# Patient Record
Sex: Male | Born: 1945
Health system: Southern US, Community
[De-identification: ages and names within clinical notes are randomized; demographics above are authoritative.]

## PROBLEM LIST (undated history)

## (undated) DIAGNOSIS — M1710 Unilateral primary osteoarthritis, unspecified knee: Secondary | ICD-10-CM

## (undated) DIAGNOSIS — E785 Hyperlipidemia, unspecified: Secondary | ICD-10-CM

## (undated) DIAGNOSIS — M179 Osteoarthritis of knee, unspecified: Secondary | ICD-10-CM

## (undated) DIAGNOSIS — I509 Heart failure, unspecified: Secondary | ICD-10-CM

## (undated) DIAGNOSIS — I10 Essential (primary) hypertension: Secondary | ICD-10-CM

## (undated) HISTORY — DX: Unilateral primary osteoarthritis, unspecified knee: M17.10

## (undated) HISTORY — DX: Heart failure, unspecified: I50.9

## (undated) HISTORY — PX: EYE SURGERY: SHX253

## (undated) HISTORY — DX: Hyperlipidemia, unspecified: E78.5

## (undated) HISTORY — DX: Osteoarthritis of knee, unspecified: M17.9

---

## 2015-03-13 DIAGNOSIS — Z23 Encounter for immunization: Secondary | ICD-10-CM | POA: Diagnosis not present

## 2015-10-26 ENCOUNTER — Emergency Department (HOSPITAL_COMMUNITY)
Admission: EM | Admit: 2015-10-26 | Discharge: 2015-10-26 | Disposition: A | Discharge status: Home / Self Care | Payer: Medicare Other | Attending: Emergency Medicine | Admitting: Emergency Medicine

## 2015-10-26 ENCOUNTER — Emergency Department (HOSPITAL_COMMUNITY): Payer: Medicare Other

## 2015-10-26 DIAGNOSIS — Y939 Activity, unspecified: Secondary | ICD-10-CM | POA: Insufficient documentation

## 2015-10-26 DIAGNOSIS — S0001XA Abrasion of scalp, initial encounter: Secondary | ICD-10-CM | POA: Insufficient documentation

## 2015-10-26 DIAGNOSIS — T148 Other injury of unspecified body region: Secondary | ICD-10-CM | POA: Diagnosis not present

## 2015-10-26 DIAGNOSIS — S299XXA Unspecified injury of thorax, initial encounter: Secondary | ICD-10-CM | POA: Diagnosis not present

## 2015-10-26 DIAGNOSIS — S0990XA Unspecified injury of head, initial encounter: Secondary | ICD-10-CM | POA: Diagnosis present

## 2015-10-26 DIAGNOSIS — Y92410 Unspecified street and highway as the place of occurrence of the external cause: Secondary | ICD-10-CM | POA: Diagnosis not present

## 2015-10-26 DIAGNOSIS — Y999 Unspecified external cause status: Secondary | ICD-10-CM | POA: Insufficient documentation

## 2015-10-26 DIAGNOSIS — M542 Cervicalgia: Secondary | ICD-10-CM

## 2015-10-26 DIAGNOSIS — Z23 Encounter for immunization: Secondary | ICD-10-CM | POA: Diagnosis not present

## 2015-10-26 DIAGNOSIS — S199XXA Unspecified injury of neck, initial encounter: Secondary | ICD-10-CM | POA: Diagnosis not present

## 2015-10-26 LAB — CBC WITH DIFFERENTIAL/PLATELET
Basophils Absolute: 0 10*3/uL (ref 0.0–0.1)
Basophils Relative: 0 %
Eosinophils Absolute: 0.1 10*3/uL (ref 0.0–0.7)
Eosinophils Relative: 1 %
HCT: 40.8 % (ref 39.0–52.0)
Hemoglobin: 13.2 g/dL (ref 13.0–17.0)
Lymphocytes Relative: 19 %
Lymphs Abs: 2 10*3/uL (ref 0.7–4.0)
MCH: 29.3 pg (ref 26.0–34.0)
MCHC: 32.4 g/dL (ref 30.0–36.0)
MCV: 90.7 fL (ref 78.0–100.0)
Monocytes Absolute: 0.6 10*3/uL (ref 0.1–1.0)
Monocytes Relative: 6 %
Neutro Abs: 7.6 10*3/uL (ref 1.7–7.7)
Neutrophils Relative %: 74 %
Platelets: 250 10*3/uL (ref 150–400)
RBC: 4.5 MIL/uL (ref 4.22–5.81)
RDW: 14.2 % (ref 11.5–15.5)
WBC: 10.3 10*3/uL (ref 4.0–10.5)

## 2015-10-26 LAB — LIPASE, BLOOD: Lipase: 28 U/L (ref 11–51)

## 2015-10-26 LAB — COMPREHENSIVE METABOLIC PANEL WITH GFR
ALT: 17 U/L (ref 17–63)
AST: 22 U/L (ref 15–41)
Albumin: 4.1 g/dL (ref 3.5–5.0)
Alkaline Phosphatase: 53 U/L (ref 38–126)
Anion gap: 8 (ref 5–15)
BUN: 16 mg/dL (ref 6–20)
CO2: 23 mmol/L (ref 22–32)
Calcium: 9.3 mg/dL (ref 8.9–10.3)
Chloride: 105 mmol/L (ref 101–111)
Creatinine, Ser: 1.33 mg/dL — ABNORMAL HIGH (ref 0.61–1.24)
GFR calc Af Amer: >60 mL/min
GFR calc non Af Amer: 54 mL/min — ABNORMAL LOW
Glucose, Bld: 112 mg/dL — ABNORMAL HIGH (ref 65–99)
Potassium: 3.2 mmol/L — ABNORMAL LOW (ref 3.5–5.1)
Sodium: 136 mmol/L (ref 135–145)
Total Bilirubin: 0.5 mg/dL (ref 0.3–1.2)
Total Protein: 7 g/dL (ref 6.5–8.1)

## 2015-10-26 MED ORDER — TETANUS-DIPHTHERIA TOXOIDS TD 5-2 LFU IM INJ
0.5000 mL | INJECTION | Freq: Once | INTRAMUSCULAR | Status: DC
  Filled 2015-10-26: qty 0.5

## 2015-10-26 MED ORDER — MELOXICAM 15 MG PO TABS: 15.0000 mg | ORAL_TABLET | Freq: Every day | ORAL | 0 refills | Status: DC | PRN

## 2015-10-26 MED ORDER — LIDOCAINE-EPINEPHRINE (PF) 2 %-1:200000 IJ SOLN: 20.0000 mL | Freq: Once | INTRAMUSCULAR | Status: DC

## 2015-10-26 MED ORDER — OXYCODONE-ACETAMINOPHEN 5-325 MG PO TABS
1.0000 | ORAL_TABLET | Freq: Once | ORAL | Status: AC
  Administered 2015-10-26: 1 via ORAL
  Filled 2015-10-26: qty 1

## 2015-10-26 MED ORDER — IBUPROFEN 400 MG PO TABS
600.0000 mg | ORAL_TABLET | Freq: Once | ORAL | Status: AC
  Administered 2015-10-26: 600 mg via ORAL
  Filled 2015-10-26: qty 1

## 2015-10-26 MED ORDER — TETANUS-DIPHTH-ACELL PERTUSSIS 5-2.5-18.5 LF-MCG/0.5 IM SUSP
0.5000 mL | Freq: Once | INTRAMUSCULAR | Status: AC
  Administered 2015-10-26: 0.5 mL via INTRAMUSCULAR
  Filled 2015-10-26: qty 0.5
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
## 2015-10-26 NOTE — ED Notes (Signed)
Pt refusing to wear a gown, he did allow this RN to assess his back and abdomen. Pt has an old scar to the middle of his back, no other abnormalities noted.
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
## 2015-12-27 DIAGNOSIS — Z23 Encounter for immunization: Secondary | ICD-10-CM | POA: Diagnosis not present

## 2016-01-30 ENCOUNTER — Encounter (HOSPITAL_BASED_OUTPATIENT_CLINIC_OR_DEPARTMENT_OTHER): Payer: Self-pay

## 2016-01-30 ENCOUNTER — Emergency Department (HOSPITAL_BASED_OUTPATIENT_CLINIC_OR_DEPARTMENT_OTHER): Payer: Medicare Other

## 2016-01-30 ENCOUNTER — Inpatient Hospital Stay (HOSPITAL_BASED_OUTPATIENT_CLINIC_OR_DEPARTMENT_OTHER)
Admission: EM | Admit: 2016-01-30 | Discharge: 2016-02-01 | DRG: 292 | Disposition: A | Discharge status: Home / Self Care | Payer: Medicare Other | Attending: Internal Medicine | Admitting: Internal Medicine

## 2016-01-30 DIAGNOSIS — F1010 Alcohol abuse, uncomplicated: Secondary | ICD-10-CM | POA: Diagnosis present

## 2016-01-30 DIAGNOSIS — I509 Heart failure, unspecified: Secondary | ICD-10-CM | POA: Diagnosis not present

## 2016-01-30 DIAGNOSIS — I447 Left bundle-branch block, unspecified: Secondary | ICD-10-CM | POA: Diagnosis not present

## 2016-01-30 DIAGNOSIS — I10 Essential (primary) hypertension: Secondary | ICD-10-CM | POA: Diagnosis not present

## 2016-01-30 DIAGNOSIS — R0602 Shortness of breath: Secondary | ICD-10-CM

## 2016-01-30 DIAGNOSIS — Z87891 Personal history of nicotine dependence: Secondary | ICD-10-CM | POA: Diagnosis not present

## 2016-01-30 DIAGNOSIS — I119 Hypertensive heart disease without heart failure: Secondary | ICD-10-CM | POA: Diagnosis present

## 2016-01-30 DIAGNOSIS — I426 Alcoholic cardiomyopathy: Secondary | ICD-10-CM | POA: Diagnosis present

## 2016-01-30 DIAGNOSIS — I472 Ventricular tachycardia: Secondary | ICD-10-CM | POA: Diagnosis not present

## 2016-01-30 DIAGNOSIS — I110 Hypertensive heart disease with heart failure: Principal | ICD-10-CM | POA: Diagnosis present

## 2016-01-30 DIAGNOSIS — R918 Other nonspecific abnormal finding of lung field: Secondary | ICD-10-CM | POA: Diagnosis present

## 2016-01-30 DIAGNOSIS — I5021 Acute systolic (congestive) heart failure: Secondary | ICD-10-CM | POA: Diagnosis present

## 2016-01-30 DIAGNOSIS — I5041 Acute combined systolic (congestive) and diastolic (congestive) heart failure: Secondary | ICD-10-CM

## 2016-01-30 DIAGNOSIS — E785 Hyperlipidemia, unspecified: Secondary | ICD-10-CM | POA: Diagnosis present

## 2016-01-30 DIAGNOSIS — I4729 Other ventricular tachycardia: Secondary | ICD-10-CM

## 2016-01-30 HISTORY — DX: Essential (primary) hypertension: I10

## 2016-01-30 LAB — CBC
HEMATOCRIT: 38.1 % — AB (ref 39.0–52.0)
HEMOGLOBIN: 12.4 g/dL — AB (ref 13.0–17.0)
MCH: 29.4 pg (ref 26.0–34.0)
MCHC: 32.5 g/dL (ref 30.0–36.0)
MCV: 90.3 fL (ref 78.0–100.0)
Platelets: 287 10*3/uL (ref 150–400)
RBC: 4.22 MIL/uL (ref 4.22–5.81)
RDW: 14.1 % (ref 11.5–15.5)
WBC: 8.1 10*3/uL (ref 4.0–10.5)

## 2016-01-30 LAB — BASIC METABOLIC PANEL
ANION GAP: 9 (ref 5–15)
BUN: 12 mg/dL (ref 6–20)
CALCIUM: 9.3 mg/dL (ref 8.9–10.3)
CO2: 24 mmol/L (ref 22–32)
Chloride: 104 mmol/L (ref 101–111)
Creatinine, Ser: 0.96 mg/dL (ref 0.61–1.24)
GLUCOSE: 97 mg/dL (ref 65–99)
POTASSIUM: 3.7 mmol/L (ref 3.5–5.1)
Sodium: 137 mmol/L (ref 135–145)

## 2016-01-30 LAB — TROPONIN I
TROPONIN I: 0.04 ng/mL — AB (ref <0.03)
TROPONIN I: 0.04 ng/mL — AB (ref <0.03)

## 2016-01-30 LAB — BRAIN NATRIURETIC PEPTIDE: B NATRIURETIC PEPTIDE 5: 918.4 pg/mL — AB (ref 0.0–100.0)

## 2016-01-30 IMAGING — DX DG CHEST 2V
2 series · 2 of 2 positions shown · non-contrast
Comparison: [DATE]

CLINICAL DATA: Short of breath

EXAM:
CHEST  2 VIEW

[chest pa]
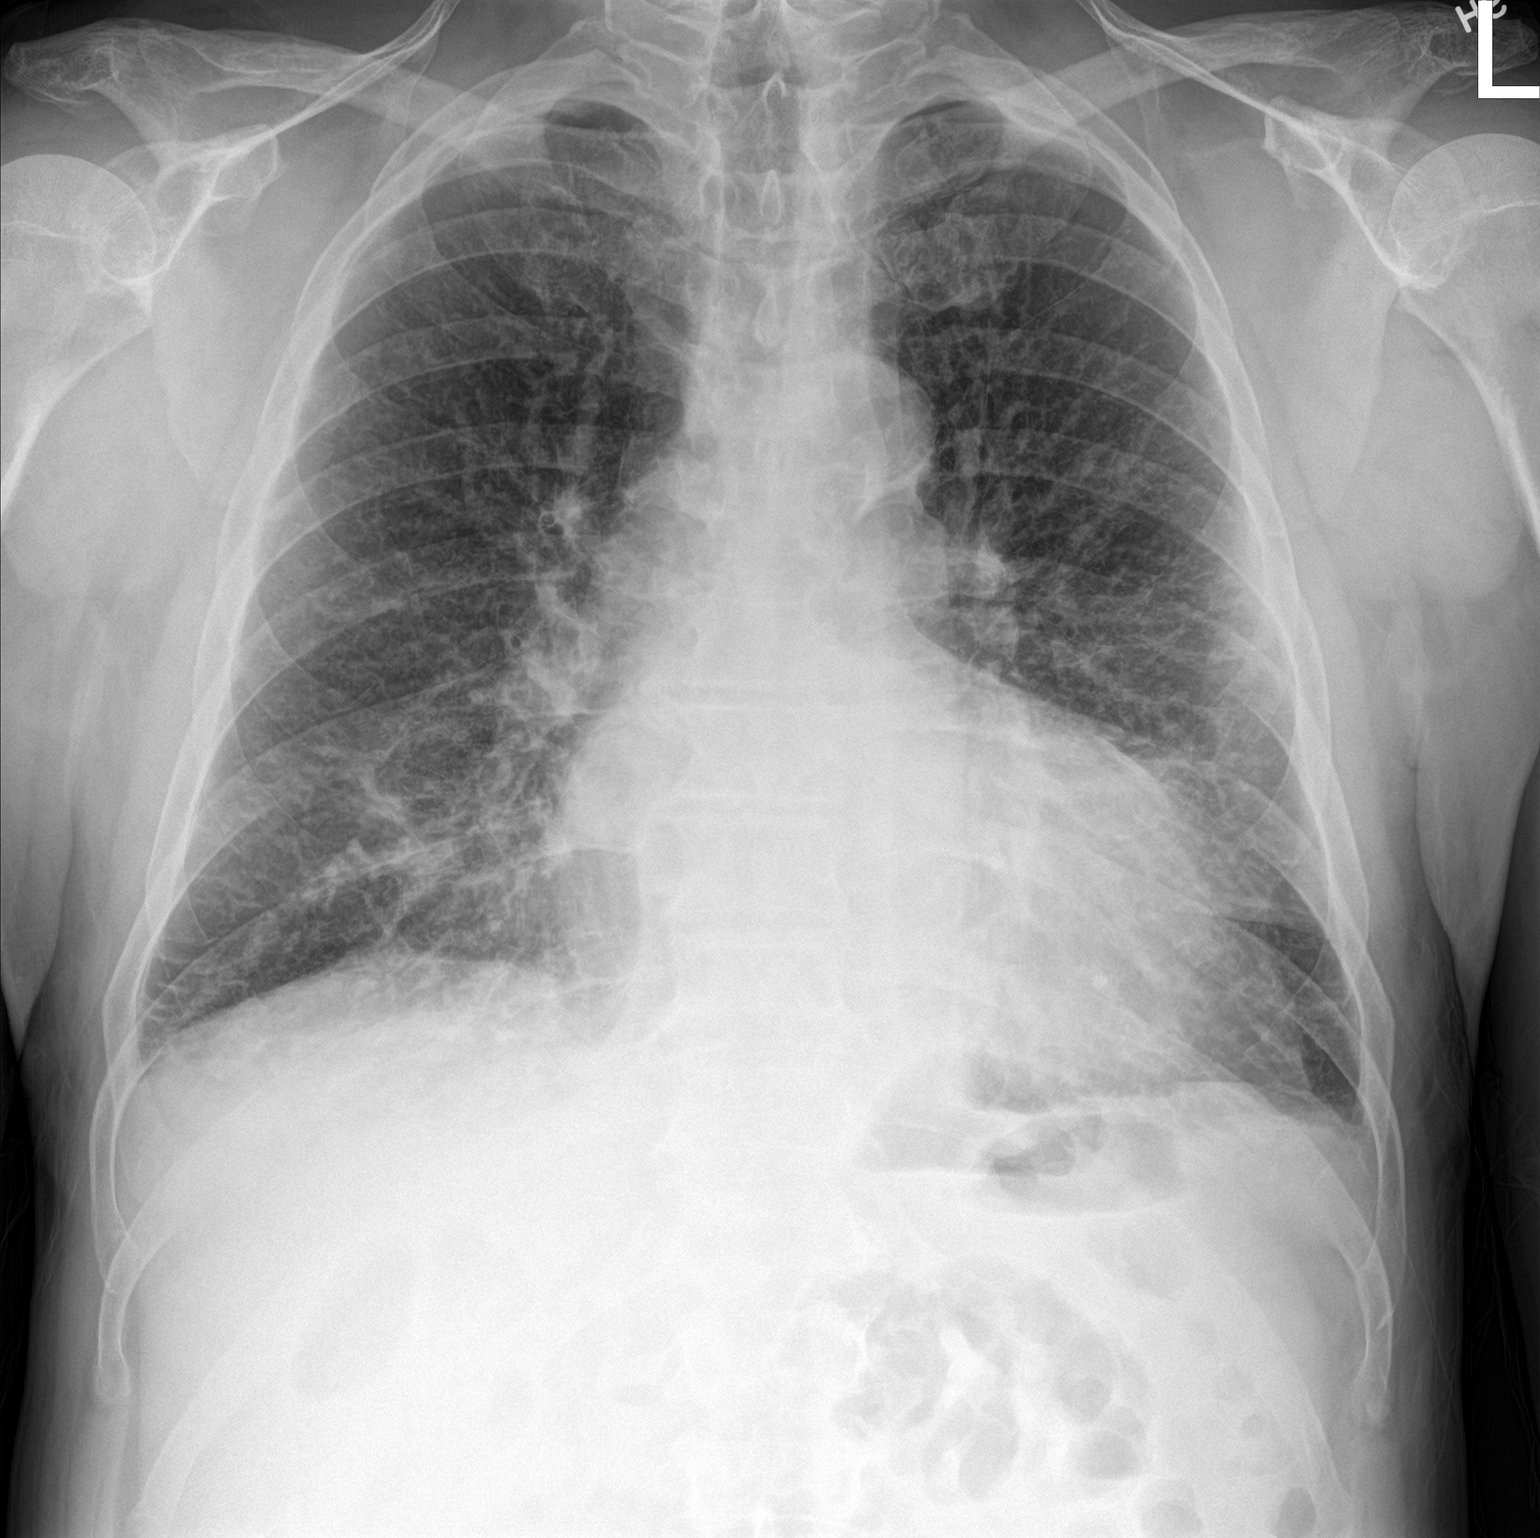

[chest lat]
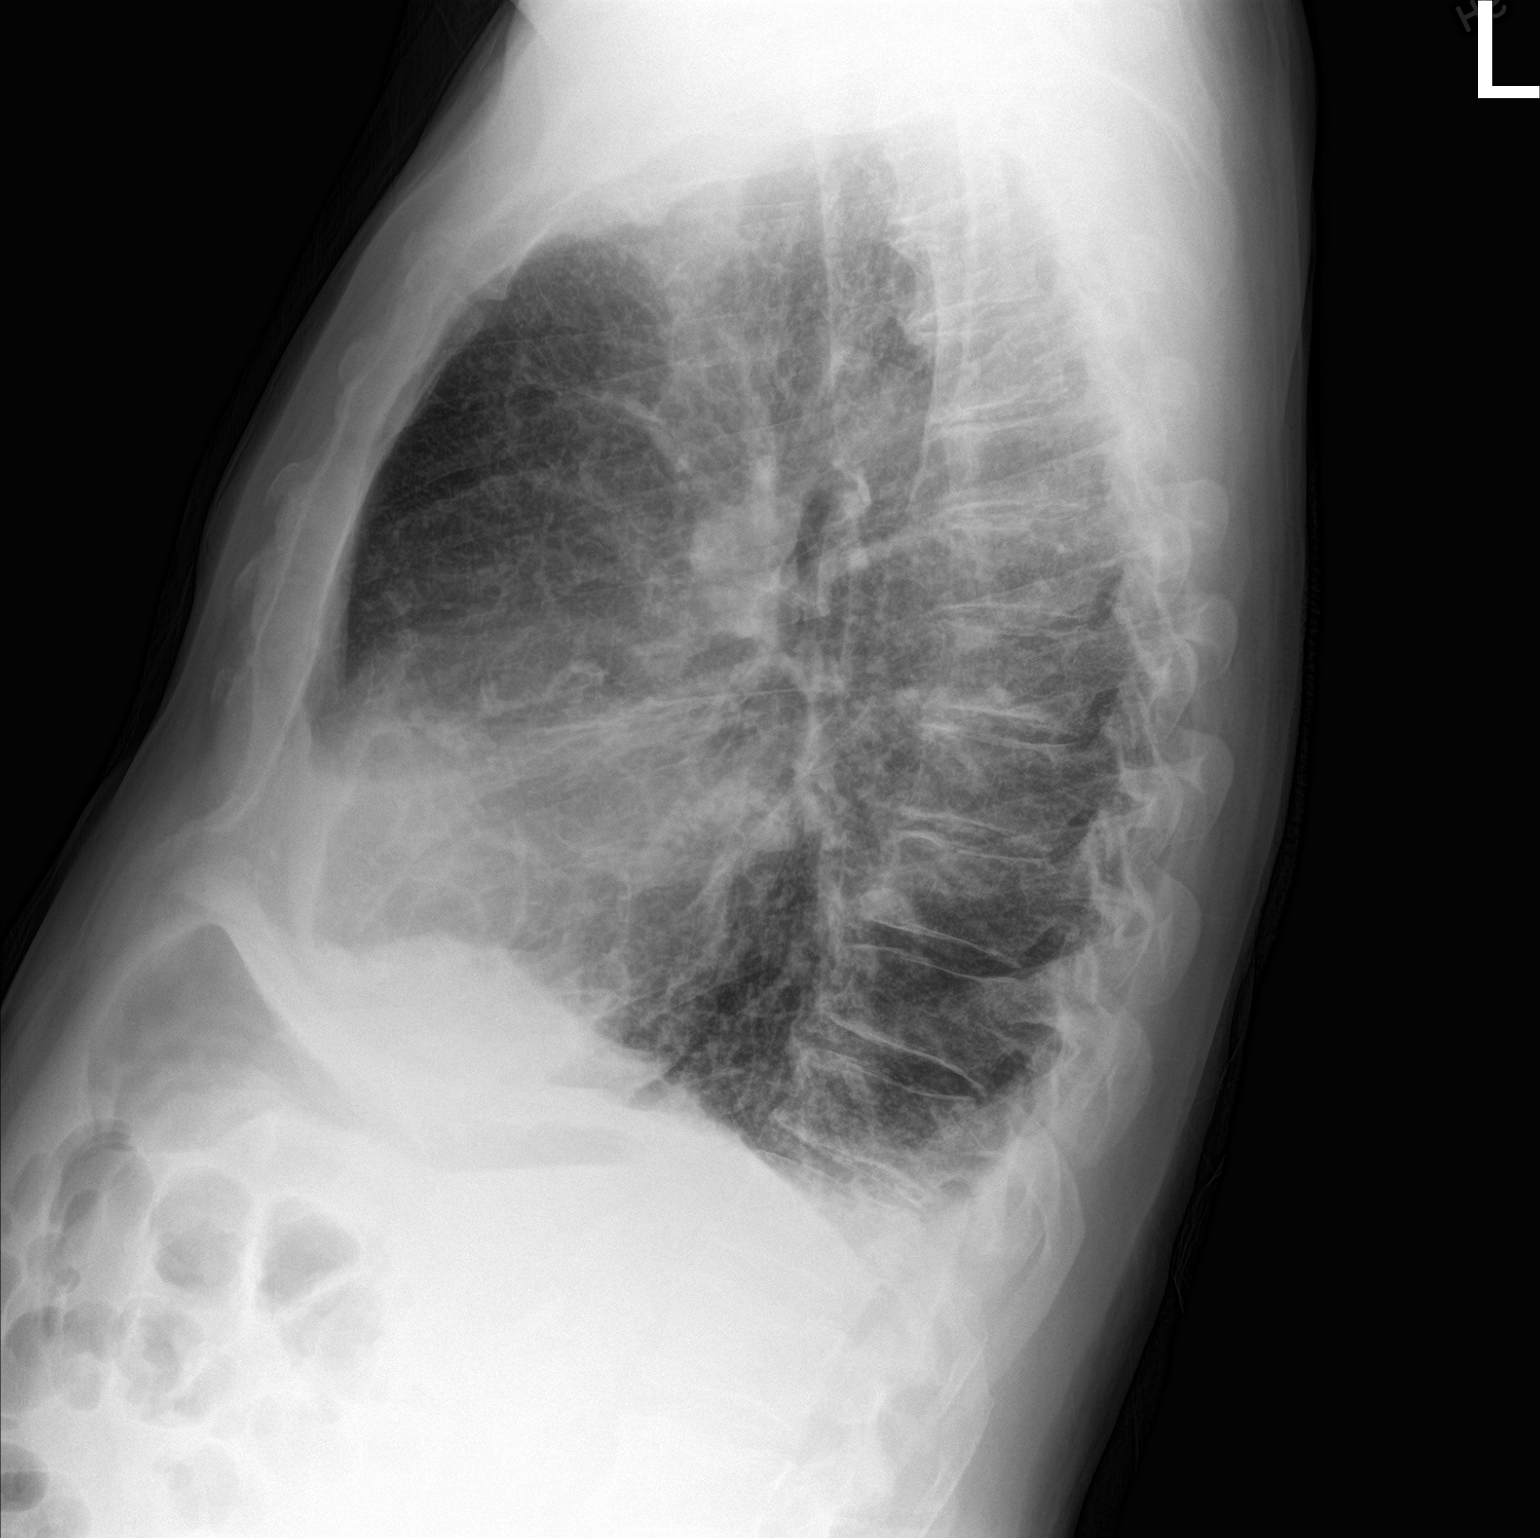

[2 of 2 positions shown; findings below may reference images not displayed]

FINDINGS: Cardiac enlargement.

Increased interstitial markings in the bases show mild progression
from the prior study. Small bilateral pleural effusions. Mild right
lower lobe airspace disease has progressed.
IMPRESSION: Interstitial lung markings have progressed in the interval with
small bilateral effusions. This may reflect mild fluid overload.

## 2016-01-30 MED ORDER — FUROSEMIDE 10 MG/ML IJ SOLN
20.0000 mg | Freq: Every day | INTRAMUSCULAR | Status: DC
  Administered 2016-01-31: 20 mg via INTRAVENOUS
  Filled 2016-01-30: qty 2

## 2016-01-30 MED ORDER — SODIUM CHLORIDE 0.9 % IV SOLN: 250.0000 mL | INTRAVENOUS | Status: DC | PRN

## 2016-01-30 MED ORDER — IOPAMIDOL (ISOVUE-370) INJECTION 76%
100.0000 mL | Freq: Once | INTRAVENOUS | Status: AC | PRN
  Administered 2016-01-30: 100 mL via INTRAVENOUS

## 2016-01-30 MED ORDER — FUROSEMIDE 10 MG/ML IJ SOLN: 40.0000 mg | Freq: Every day | INTRAMUSCULAR | Status: DC

## 2016-01-30 MED ORDER — ENOXAPARIN SODIUM 40 MG/0.4ML ~~LOC~~ SOLN
40.0000 mg | SUBCUTANEOUS | Status: DC
  Administered 2016-01-30 – 2016-01-31 (×2): 40 mg via SUBCUTANEOUS
  Filled 2016-01-30 (×2): qty 0.4

## 2016-01-30 MED ORDER — LISINOPRIL 5 MG PO TABS
5.0000 mg | ORAL_TABLET | Freq: Every day | ORAL | Status: DC
  Administered 2016-01-31 – 2016-02-01 (×2): 5 mg via ORAL
  Filled 2016-01-30 (×2): qty 1

## 2016-01-30 MED ORDER — ONDANSETRON HCL 4 MG/2ML IJ SOLN: 4.0000 mg | Freq: Four times a day (QID) | INTRAMUSCULAR | Status: DC | PRN

## 2016-01-30 MED ORDER — SODIUM CHLORIDE 0.9% FLUSH
3.0000 mL | Freq: Two times a day (BID) | INTRAVENOUS | Status: DC
  Administered 2016-01-30 – 2016-02-01 (×4): 3 mL via INTRAVENOUS

## 2016-01-30 MED ORDER — ACETAMINOPHEN 325 MG PO TABS: 650.0000 mg | ORAL_TABLET | ORAL | Status: DC | PRN

## 2016-01-30 MED ORDER — SODIUM CHLORIDE 0.9% FLUSH: 3.0000 mL | INTRAVENOUS | Status: DC | PRN

## 2016-01-30 MED ORDER — FUROSEMIDE 10 MG/ML IJ SOLN
20.0000 mg | Freq: Once | INTRAMUSCULAR | Status: AC
  Administered 2016-01-30: 20 mg via INTRAVENOUS
  Filled 2016-01-30: qty 2

## 2016-01-30 NOTE — ED Provider Notes (Signed)
  Physical Exam  BP (!) 165/120   Pulse 98   Temp 97.6 F (36.4 C) (Oral)   Resp 18   Ht 5\' 9"  (1.753 m)   Wt 86.2 kg   SpO2 97%   BMI 28.06 kg/m   Physical Exam  ED Course  Procedures  MDM 4:34 PM- Sign out from Previous Provider Domenic Moras, PA-C  Per Previous MDM- Patient here with exertional shortness of breath, gradual in onset. He does not follow with primary care doctor and does not have any significant past medical history aside from hypertension not currently being treated. Given his age and his complaint, cardiac workup performed. A d-dimer ordered to help rule out PE. I will also check a BNP, troponin, EKG and chest x-ray. Patient is not actively wheezing and is not hypoxic on exam.   EKG with LBBB, no old EKG for comparison.  Pt denies active CP. Initial chest x-ray shows mild CHF.  A chest CT angiogram was performed demonstrating small bilateral pleural effusions without evidence of PE.  Mildly elevated troponin of 0.04. His BNP is elevated at 918.  Lasix 20mg  IV given.  Admit for new onset heart failure, new left bundle branch block. Patient was understanding and agrees to plan.    4:43 PM- Spoke with Dr. Marily Memos. Will Admit to [place], PA-C 01/30/16 1643    Charlesetta Shanks, MD 02/03/16 [phone]

## 2016-01-30 NOTE — ED Provider Notes (Signed)
[facility] DEPT [facility] Provider Note   CSN: [id number] Arrival date & time: 01/30/16  1306     History   Chief Complaint Chief Complaint  Patient presents with  . Shortness of Breath    HPI Shane Gomez is a 70 y.o. male.  HPI   70 year old male who is a former smoker presenting with complaints of shortness of breath. Patient reports gradual onset of shortness of breath ongoing for the past week. Soreness of breath is worsening with exertion such as running or walking up and down the ladder. He reported occasional episodes of shortness of breath with diaphoresis and lightheadedness. Last episode was earlier this morning when he walked down ladder. He denies any associated fever, productive cough, headache, chest pain, nausea, vomiting, dizziness, productive cough, hemoptysis. No prior history of PE or DVT, no recent surgery, prolonged bed rest, UA Rx swelling or calf pain, active cancer.  Does have a history of hypertension but currently not taking any medication and does not have a primary care provider. Denies any history of cardiac disease and no recent stress test. Former smoker last use was 30 years ago. Patient has not notice any increased fluid gain. He sleeps with 2 pillows  Past Medical History:  Diagnosis Date  . Hypertension     There are no active problems to display for this patient.   Past Surgical History:  Procedure Laterality Date  . EYE SURGERY         Home Medications    Prior to Admission medications   Not on File    Family History No family history on file.  Social History Social History  Substance Use Topics  . Smoking status: Former Research scientist (life sciences)  . Smokeless tobacco: Never Used  . Alcohol use Yes     Comment: occ     Allergies   Review of patient's allergies indicates no known allergies.   Review of Systems Review of Systems  All other systems reviewed and are negative.    Physical Exam Updated Vital Signs BP (!) 165/113 (BP  Location: Left Arm)   Pulse 104   Temp 97.6 F (36.4 C) (Oral)   Resp 18   Ht 5\' 9"  (1.753 m)   Wt 86.2 kg   SpO2 98%   BMI 28.06 kg/m   Physical Exam  Constitutional: He appears well-developed and well-nourished. No distress.  HENT:  Head: Atraumatic.  Eyes: Conjunctivae are normal.  Neck: Neck supple. No JVD present.  Cardiovascular: Regular rhythm and intact distal pulses.   Mild tachycardia without murmurs or gallops  Pulmonary/Chest: Effort normal and breath sounds normal. He has no wheezes. He has no rales.  Abdominal: Soft. There is no tenderness.  Musculoskeletal: He exhibits no edema.  Neurological: He is alert.  Skin: No rash noted.  Psychiatric: He has a normal mood and affect.  Nursing note and vitals reviewed.    ED Treatments / Results  Labs (all labs ordered are listed, but only abnormal results are displayed) Labs Reviewed  CBC - Abnormal; Notable for the following:       Result Value   Hemoglobin 12.4 (*)    HCT 38.1 (*)    All other components within normal limits  TROPONIN I - Abnormal; Notable for the following:    Troponin I 0.04 (*)    All other components within normal limits  BRAIN NATRIURETIC PEPTIDE - Abnormal; Notable for the following:    B Natriuretic Peptide 918.4 (*)    All other components  within normal limits  BASIC METABOLIC PANEL  TROPONIN I    EKG  EKG Interpretation  Date/Time:  Tuesday January 30 2016 14:12:17 EDT Ventricular Rate:  94 PR Interval:    QRS Duration: 121 QT Interval:  397 QTC Calculation: 497 R Axis:   78 Text Interpretation:  Sinus rhythm Probable left atrial enlargement Left bundle branch block agree. no old comparison Confirmed by Johnney Killian, MD, Jeannie Done [id number]) on 01/30/2016 2:45:14 PM       Radiology Dg Chest 2 View  Result Date: 01/30/2016 CLINICAL DATA:  Short of breath EXAM: CHEST  2 VIEW COMPARISON:  10/26/2015 FINDINGS: Cardiac enlargement. Increased interstitial markings in the bases show  mild progression from the prior study. Small bilateral pleural effusions. Mild right lower lobe airspace disease has progressed. IMPRESSION: Interstitial lung markings have progressed in the interval with small bilateral effusions. This may reflect mild fluid overload. Electronically Signed   By: Franchot Gallo M.D.   On: 01/30/2016 13:47   Ct Angio Chest Pe W Or Wo Contrast  Result Date: 01/30/2016 CLINICAL DATA:  Shortness of breath with exertion for 1 week EXAM: CT ANGIOGRAPHY CHEST WITH CONTRAST TECHNIQUE: Multidetector CT imaging of the chest was performed using the standard protocol during bolus administration of intravenous contrast. Multiplanar CT image reconstructions and MIPs were obtained to evaluate the vascular anatomy. CONTRAST:  100 mL Isovue 370. COMPARISON:  Plain film from earlier in the same day FINDINGS: Cardiovascular: Thoracic aorta shows atherosclerotic calcifications without aneurysmal dilatation. The degree of opacification is minimal precluding evaluation for possible dissection. Heavy coronary calcifications are seen. The pulmonary artery is well visualized and demonstrates a normal branching pattern. No filling defects to suggest pulmonary emboli are identified. Mediastinum/Nodes: Small right peritracheal node is noted measuring 10 mm in short axis. No other significant hilar or mediastinal adenopathy is seen. Lungs/Pleura: Small bilateral pleural effusions are noted right slightly greater than left. Some patchy areas of subpleural infiltrate are noted bilaterally. A small 6 mm nodule is noted in the right upper lobe medially best seen on image number 23 of series 5. A 12 mm sub solid density is noted in the left upper lobe best seen on image number 48 of series 5. Mild scarring is noted in the right middle lobe. Mild interstitial thickening is noted throughout both lungs which may represent a a mild degree of interstitial edema. Upper Abdomen: No acute abnormality. Musculoskeletal:  Degenerative changes of thoracic spine are noted. Review of the MIP images confirms the above findings. IMPRESSION: Small bilateral pleural effusions. No evidence of pulmonary emboli. Subpleural infiltrative changes seen bilaterally but particularly on the left. 6 mm nodule in the right upper lobe medially and sub solid left upper lobe nodule measuring 12 mm. Follow-up non-contrast CT recommended at 3-6 months to confirm persistence. If unchanged, and solid component remains <6 mm, annual CT is recommended until 5 years of stability has been established. If persistent these nodules should be considered highly suspicious if the solid component of the nodule is 6 mm or greater in size and enlarging. This recommendation follows the consensus statement: Guidelines for Management of Incidental Pulmonary Nodules Detected on CT Images: From the Fleischner Society 2017; Radiology 2017; 284:228-243. Mild interstitial edema. Electronically Signed   By: Inez Catalina M.D.   On: 01/30/2016 15:24    Procedures Procedures (including critical care time)  Medications Ordered in ED Medications  furosemide (LASIX) injection 20 mg (not administered)  iopamidol (ISOVUE-370) 76 % injection 100 mL (100 mLs  Intravenous Contrast Given 01/30/16 1459)     Initial Impression / Assessment and Plan / ED Course  I have reviewed the triage vital signs and the nursing notes.  Pertinent labs & imaging results that were available during my care of the patient were reviewed by me and considered in my medical decision making (see chart for details).  Clinical Course    BP (!) 165/120   Pulse 98   Temp 97.6 F (36.4 C) (Oral)   Resp 18   Ht 5\' 9"  (1.753 m)   Wt 86.2 kg   SpO2 97%   BMI 28.06 kg/m    Final Clinical Impressions(s) / ED Diagnoses   Final diagnoses:  Acute congestive heart failure, unspecified congestive heart failure type (HCC)  Left bundle branch block  Shortness of breath    New Prescriptions New  Prescriptions   No medications on file   2:02 PM Patient here with exertional shortness of breath, gradual in onset. He does not follow with primary care doctor and does not have any significant past medical history aside from hypertension not currently being treated. Given his age and his complaint, cardiac workup performed. A d-dimer ordered to help rule out PE. I will also check a BNP, troponin, EKG and chest x-ray. Patient is not actively wheezing and is not hypoxic on exam.   2:45 PM EKG with LBBB, no old EKG for comparison.  Pt denies active CP.  If PE study unremarkable, will consider obs admission for CP r/o.  3:52 PM Initial chest x-ray shows mild CHF. A chest CT angiogram was performed demonstrating small bilateral pleural effusions without evidence of PE. Subpleural infiltrative changes seen bilaterally more prominent in the left. Is also a 6 mm nodule in the right upper lobe as well as a subsequent left upper nodule in the left upper lobe measuring 12 mm. It is recommended for patient to have a repeat CT scan in 3-6 months. Patient made aware of this.  Furthermore, EKG shows a new left bundle branch block without prior EKG for comparison. Mildly elevated troponin of 0.04. His BNP is elevated at 918. Given these finding, patient will be admitted for further evaluation of new onset heart failure, new left bundle branch block. Patient was understanding and agrees to plan.  Care discussed with Dr. Johnney Killian.   4:27 PM Sign out to oncoming provider who will consult medicine for admission of new onset heart failure and new LBBB.  Lasix 20mg  IV given.        Domenic Moras, PA-C 01/31/16 0901    Charlesetta Shanks, MD 02/03/16 [phone]

## 2016-01-30 NOTE — ED Notes (Signed)
Two urinals given to patient at bedside

## 2016-01-30 NOTE — H&P (Signed)
History and Physical    Shane Gomez [id number] DOB: Nov 22, 1945 DOA: 01/30/2016   PCP: No PCP Per Patient Chief Complaint:  Chief Complaint  Patient presents with  . Shortness of Breath    HPI: Shane Gomez is a 70 y.o. male with medical history significant of HTN.  Patient presents to the ED at [facility] this evening with c/o 1 week history of progressive DOE and SOB.  Patient denies peripheral edema.  Has h/o HTN but admits to not taking any meds for this.  He dosent have a PCP.  No h/o chest pain.  Uses 2 pillows to sleep.  Former smoker quit 30 years ago.  ED Course: Work up suggestive of new onset CHF, patient with LBBB on EKG no prior available for comparison.  Trop 0.04 x2.  BNP elevated.  Given lasix 20mg  with good diuresis.  Has 8 beat run of V tach on monitor.  Review of Systems: As per HPI otherwise 10 point review of systems negative.    Past Medical History:  Diagnosis Date  . Hypertension     Past Surgical History:  Procedure Laterality Date  . EYE SURGERY       reports that he has quit smoking. He has never used smokeless tobacco. He reports that he drinks alcohol. He reports that he does not use drugs.  No Known Allergies  Family History  Problem Relation Age of Onset  . Heart failure Neg Hx       Prior to Admission medications   Medication Sig Start Date End Date Taking? Authorizing Provider  Multiple Vitamin (MULTIVITAMIN WITH MINERALS) TABS tablet Take 1 tablet by mouth daily.   Yes Historical Provider, MD  omega-3 acid ethyl esters (LOVAZA) 1 g capsule Take 1 g by mouth daily.   Yes Historical Provider, MD    Physical Exam: Vitals:   01/30/16 1630 01/30/16 1700 01/30/16 1730 01/30/16 1906  BP: 168/98 (!) 156/109 151/93 (!) 141/92  Pulse: 97 95 86 94  Resp: 22 24 17    Temp:    98.9 F (37.2 C)  TempSrc:    Oral  SpO2: 93% 93% 91% 99%  Weight:      Height:          Constitutional: NAD, calm, comfortable Eyes: PERRL, lids and  conjunctivae normal ENMT: Mucous membranes are moist. Posterior pharynx clear of any exudate or lesions.Normal dentition.  Neck: normal, supple, no masses, no thyromegaly Respiratory: clear to auscultation bilaterally, no wheezing, no crackles. Normal respiratory effort. No accessory muscle use.  Cardiovascular: Regular rate and rhythm, no murmurs / rubs / gallops. No extremity edema. 2+ pedal pulses. No carotid bruits.  Abdomen: no tenderness, no masses palpated. No hepatosplenomegaly. Bowel sounds positive.  Musculoskeletal: no clubbing / cyanosis. No joint deformity upper and lower extremities. Good ROM, no contractures. Normal muscle tone.  Skin: no rashes, lesions, ulcers. No induration Neurologic: CN 2-12 grossly intact. Sensation intact, DTR normal. Strength 5/5 in all 4.  Psychiatric: Normal judgment and insight. Alert and oriented x 3. Normal mood.    Labs on Admission: I have personally reviewed following labs and imaging studies  CBC:  Recent Labs Lab 01/30/16 1415  WBC 8.1  HGB 12.4*  HCT 38.1*  MCV 90.3  PLT A999333   Basic Metabolic Panel:  Recent Labs Lab 01/30/16 1415  NA 137  K 3.7  CL 104  CO2 24  GLUCOSE 97  BUN 12  CREATININE 0.96  CALCIUM 9.3   GFR: Estimated Creatinine  Clearance: 81.2 mL/min (by C-G formula based on SCr of 0.96 mg/dL). Liver Function Tests: No results for input(s): AST, ALT, ALKPHOS, BILITOT, PROT, ALBUMIN in the last 168 hours. No results for input(s): LIPASE, AMYLASE in the last 168 hours. No results for input(s): AMMONIA in the last 168 hours. Coagulation Profile: No results for input(s): INR, PROTIME in the last 168 hours. Cardiac Enzymes:  Recent Labs Lab 01/30/16 1415 01/30/16 1620  TROPONINI 0.04* 0.04*   BNP (last 3 results) No results for input(s): PROBNP in the last 8760 hours. HbA1C: No results for input(s): HGBA1C in the last 72 hours. CBG: No results for input(s): GLUCAP in the last 168 hours. Lipid  Profile: No results for input(s): CHOL, HDL, LDLCALC, TRIG, CHOLHDL, LDLDIRECT in the last 72 hours. Thyroid Function Tests: No results for input(s): TSH, T4TOTAL, FREET4, T3FREE, THYROIDAB in the last 72 hours. Anemia Panel: No results for input(s): VITAMINB12, FOLATE, FERRITIN, TIBC, IRON, RETICCTPCT in the last 72 hours. Urine analysis: No results found for: COLORURINE, APPEARANCEUR, LABSPEC, PHURINE, GLUCOSEU, HGBUR, BILIRUBINUR, KETONESUR, PROTEINUR, UROBILINOGEN, NITRITE, LEUKOCYTESUR Sepsis Labs: @LABRCNTIP (procalcitonin:4,lacticidven:4) )No results found for this or any previous visit (from the past 240 hour(s)).   Radiological Exams on Admission: Dg Chest 2 View  Result Date: 01/30/2016 CLINICAL DATA:  Short of breath EXAM: CHEST  2 VIEW COMPARISON:  10/26/2015 FINDINGS: Cardiac enlargement. Increased interstitial markings in the bases show mild progression from the prior study. Small bilateral pleural effusions. Mild right lower lobe airspace disease has progressed. IMPRESSION: Interstitial lung markings have progressed in the interval with small bilateral effusions. This may reflect mild fluid overload. Electronically Signed   By: Franchot Gallo M.D.   On: 01/30/2016 13:47   Ct Angio Chest Pe W Or Wo Contrast  Result Date: 01/30/2016 CLINICAL DATA:  Shortness of breath with exertion for 1 week EXAM: CT ANGIOGRAPHY CHEST WITH CONTRAST TECHNIQUE: Multidetector CT imaging of the chest was performed using the standard protocol during bolus administration of intravenous contrast. Multiplanar CT image reconstructions and MIPs were obtained to evaluate the vascular anatomy. CONTRAST:  100 mL Isovue 370. COMPARISON:  Plain film from earlier in the same day FINDINGS: Cardiovascular: Thoracic aorta shows atherosclerotic calcifications without aneurysmal dilatation. The degree of opacification is minimal precluding evaluation for possible dissection. Heavy coronary calcifications are seen. The  pulmonary artery is well visualized and demonstrates a normal branching pattern. No filling defects to suggest pulmonary emboli are identified. Mediastinum/Nodes: Small right peritracheal node is noted measuring 10 mm in short axis. No other significant hilar or mediastinal adenopathy is seen. Lungs/Pleura: Small bilateral pleural effusions are noted right slightly greater than left. Some patchy areas of subpleural infiltrate are noted bilaterally. A small 6 mm nodule is noted in the right upper lobe medially best seen on image number 23 of series 5. A 12 mm sub solid density is noted in the left upper lobe best seen on image number 48 of series 5. Mild scarring is noted in the right middle lobe. Mild interstitial thickening is noted throughout both lungs which may represent a a mild degree of interstitial edema. Upper Abdomen: No acute abnormality. Musculoskeletal: Degenerative changes of thoracic spine are noted. Review of the MIP images confirms the above findings. IMPRESSION: Small bilateral pleural effusions. No evidence of pulmonary emboli. Subpleural infiltrative changes seen bilaterally but particularly on the left. 6 mm nodule in the right upper lobe medially and sub solid left upper lobe nodule measuring 12 mm. Follow-up non-contrast CT recommended at 3-6  months to confirm persistence. If unchanged, and solid component remains <6 mm, annual CT is recommended until 5 years of stability has been established. If persistent these nodules should be considered highly suspicious if the solid component of the nodule is 6 mm or greater in size and enlarging. This recommendation follows the consensus statement: Guidelines for Management of Incidental Pulmonary Nodules Detected on CT Images: From the Fleischner Society 2017; Radiology 2017; 284:228-243. Mild interstitial edema. Electronically Signed   By: Inez Catalina M.D.   On: 01/30/2016 15:24    EKG: Independently reviewed.  Assessment/Plan Principal  Problem:   New onset of congestive heart failure (HCC) Active Problems:   LBBB (left bundle branch block)   NSVT (nonsustained ventricular tachycardia) (HCC)   HTN (hypertension)    1. New onset CHF - 1. CHF pathway 2. Lasix 20mg  IV daily 3. Tele monitor 4. 2d echo 2. LBBB and reported 8 beat run of NSVT in ED - 1. See CHF work up above 2. Probably warrants cardiology consultation during admission. 3. HTN - 1. Start lisinopril 5mg  PO daily 2. Needs PCP follow up for lab work.   DVT prophylaxis: Lovenox Code Status: Full Family Communication: No family in room Consults called: None Admission status: Admit to obs   [place], [facility] Pager [phone] from 7PM-7AM  If 7AM-7PM, please contact the day physician for the patient [email or web address] Password [email or web address]  01/30/2016, 8:04 PM

## 2016-01-30 NOTE — ED Notes (Signed)
Pt had 8 beat run of vtach. [place] Therapist, sports notified and speaking to [place], [place] at this time

## 2016-01-30 NOTE — ED Triage Notes (Signed)
SOB x 1 week-NAD-steady gait

## 2016-01-30 NOTE — Progress Notes (Signed)
Patient coming from [place] due to progressive dyspnea on exertion and found to have a new left bundle branch block with elevated BNP and neurologic findings concerning for CHF. No previous diagnoses of CHF. No chest pain. Troponin mildly elevated at 0.04. Patient's blood pressure markedly elevated and noncompliant with home blood pressure medications. Diureses well and blood pressure improving on IV Lasix. Patient accepted to telemetry bed under observation status.  Linna Darner, MD [facility] 01/30/2016, 4:44 PM

## 2016-01-31 ENCOUNTER — Observation Stay (HOSPITAL_COMMUNITY): Payer: Medicare Other

## 2016-01-31 ENCOUNTER — Observation Stay (HOSPITAL_BASED_OUTPATIENT_CLINIC_OR_DEPARTMENT_OTHER): Payer: Medicare Other

## 2016-01-31 DIAGNOSIS — I447 Left bundle-branch block, unspecified: Secondary | ICD-10-CM | POA: Diagnosis not present

## 2016-01-31 DIAGNOSIS — E785 Hyperlipidemia, unspecified: Secondary | ICD-10-CM | POA: Diagnosis present

## 2016-01-31 DIAGNOSIS — I426 Alcoholic cardiomyopathy: Secondary | ICD-10-CM | POA: Diagnosis present

## 2016-01-31 DIAGNOSIS — I472 Ventricular tachycardia: Secondary | ICD-10-CM | POA: Diagnosis not present

## 2016-01-31 DIAGNOSIS — Z87891 Personal history of nicotine dependence: Secondary | ICD-10-CM | POA: Diagnosis not present

## 2016-01-31 DIAGNOSIS — I10 Essential (primary) hypertension: Secondary | ICD-10-CM

## 2016-01-31 DIAGNOSIS — I509 Heart failure, unspecified: Secondary | ICD-10-CM | POA: Diagnosis not present

## 2016-01-31 DIAGNOSIS — R0602 Shortness of breath: Secondary | ICD-10-CM

## 2016-01-31 DIAGNOSIS — I5021 Acute systolic (congestive) heart failure: Secondary | ICD-10-CM | POA: Diagnosis present

## 2016-01-31 DIAGNOSIS — F1010 Alcohol abuse, uncomplicated: Secondary | ICD-10-CM | POA: Diagnosis present

## 2016-01-31 DIAGNOSIS — R918 Other nonspecific abnormal finding of lung field: Secondary | ICD-10-CM | POA: Diagnosis present

## 2016-01-31 DIAGNOSIS — I110 Hypertensive heart disease with heart failure: Secondary | ICD-10-CM | POA: Diagnosis present

## 2016-01-31 LAB — BASIC METABOLIC PANEL
Anion gap: 10 (ref 5–15)
BUN: 8 mg/dL (ref 6–20)
CALCIUM: 9.1 mg/dL (ref 8.9–10.3)
CO2: 26 mmol/L (ref 22–32)
CREATININE: 1.12 mg/dL (ref 0.61–1.24)
Chloride: 103 mmol/L (ref 101–111)
GFR calc Af Amer: >60 mL/min (ref >60)
GLUCOSE: 105 mg/dL — AB (ref 65–99)
Potassium: 3.9 mmol/L (ref 3.5–5.1)
Sodium: 139 mmol/L (ref 135–145)

## 2016-01-31 LAB — NM MYOCAR MULTI W/SPECT W/WALL MOTION / EF
CSEPED: 5 min
CSEPEW: 1 METS
CSEPHR: 57 %
CSEPPHR: 88 {beats}/min
MPHR: 153 {beats}/min
Rest HR: 75 {beats}/min

## 2016-01-31 LAB — LIPID PANEL
Cholesterol: 282 mg/dL — ABNORMAL HIGH (ref 0–200)
HDL: 50 mg/dL (ref >40)
LDL CALC: 191 mg/dL — AB (ref 0–99)
Total CHOL/HDL Ratio: 5.6 RATIO
Triglycerides: 207 mg/dL — ABNORMAL HIGH (ref <150)
VLDL: 41 mg/dL — ABNORMAL HIGH (ref 0–40)

## 2016-01-31 LAB — ECHOCARDIOGRAM COMPLETE
Height: 71 in
Weight: 3022.4 oz

## 2016-01-31 LAB — MAGNESIUM: MAGNESIUM: 2.3 mg/dL (ref 1.7–2.4)

## 2016-01-31 MED ORDER — LORAZEPAM 1 MG PO TABS: 1.0000 mg | ORAL_TABLET | Freq: Four times a day (QID) | ORAL | Status: DC | PRN

## 2016-01-31 MED ORDER — VITAMIN B-1 100 MG PO TABS
100.0000 mg | ORAL_TABLET | Freq: Every day | ORAL | Status: DC
  Administered 2016-01-31 – 2016-02-01 (×2): 100 mg via ORAL
  Filled 2016-01-31 (×2): qty 1

## 2016-01-31 MED ORDER — TECHNETIUM TC 99M TETROFOSMIN IV KIT
30.0000 | PACK | Freq: Once | INTRAVENOUS | Status: AC | PRN
  Administered 2016-01-31: 30 via INTRAVENOUS

## 2016-01-31 MED ORDER — LORAZEPAM 1 MG PO TABS: 0.0000 mg | ORAL_TABLET | Freq: Two times a day (BID) | ORAL | Status: DC

## 2016-01-31 MED ORDER — REGADENOSON 0.4 MG/5ML IV SOLN
0.4000 mg | Freq: Once | INTRAVENOUS | Status: AC
  Administered 2016-01-31: 0.4 mg via INTRAVENOUS
  Filled 2016-01-31: qty 5

## 2016-01-31 MED ORDER — FOLIC ACID 1 MG PO TABS: 1.0000 mg | ORAL_TABLET | Freq: Every day | ORAL | Status: DC

## 2016-01-31 MED ORDER — FUROSEMIDE 10 MG/ML IJ SOLN
40.0000 mg | Freq: Once | INTRAMUSCULAR | Status: AC
  Administered 2016-01-31: 40 mg via INTRAVENOUS
  Filled 2016-01-31: qty 4

## 2016-01-31 MED ORDER — LORAZEPAM 2 MG/ML IJ SOLN: 1.0000 mg | Freq: Four times a day (QID) | INTRAMUSCULAR | Status: DC | PRN

## 2016-01-31 MED ORDER — ADULT MULTIVITAMIN W/MINERALS CH: 1.0000 | ORAL_TABLET | Freq: Every day | ORAL | Status: DC

## 2016-01-31 MED ORDER — TECHNETIUM TC 99M TETROFOSMIN IV KIT
10.0000 | PACK | Freq: Once | INTRAVENOUS | Status: AC | PRN
  Administered 2016-01-31: 10 via INTRAVENOUS

## 2016-01-31 MED ORDER — FOLIC ACID 1 MG PO TABS
1.0000 mg | ORAL_TABLET | Freq: Every day | ORAL | Status: DC
  Administered 2016-01-31 – 2016-02-01 (×2): 1 mg via ORAL
  Filled 2016-01-31 (×2): qty 1

## 2016-01-31 MED ORDER — CARVEDILOL 3.125 MG PO TABS
3.1250 mg | ORAL_TABLET | Freq: Two times a day (BID) | ORAL | Status: DC
  Administered 2016-01-31 – 2016-02-01 (×2): 3.125 mg via ORAL
  Filled 2016-01-31 (×2): qty 1

## 2016-01-31 MED ORDER — THIAMINE HCL 100 MG/ML IJ SOLN
100.0000 mg | Freq: Every day | INTRAMUSCULAR | Status: DC
Start: 2016-01-31 — End: 2016-01-31

## 2016-01-31 MED ORDER — THIAMINE HCL 100 MG/ML IJ SOLN: 100.0000 mg | Freq: Every day | INTRAMUSCULAR | Status: DC

## 2016-01-31 MED ORDER — VITAMIN B-1 100 MG PO TABS: 100.0000 mg | ORAL_TABLET | Freq: Every day | ORAL | Status: DC

## 2016-01-31 MED ORDER — ADULT MULTIVITAMIN W/MINERALS CH
1.0000 | ORAL_TABLET | Freq: Every day | ORAL | Status: DC
  Administered 2016-01-31 – 2016-02-01 (×2): 1 via ORAL
  Filled 2016-01-31 (×2): qty 1

## 2016-01-31 MED ORDER — REGADENOSON 0.4 MG/5ML IV SOLN
INTRAVENOUS | Status: AC
  Filled 2016-01-31: qty 5

## 2016-01-31 MED ORDER — LORAZEPAM 1 MG PO TABS: 0.0000 mg | ORAL_TABLET | Freq: Four times a day (QID) | ORAL | Status: DC

## 2016-01-31 NOTE — Progress Notes (Signed)
  Echocardiogram 2D Echocardiogram has been performed.  Tresa Res 01/31/2016, 3:10 PM

## 2016-01-31 NOTE — Care Management Obs Status (Signed)
[facility] NOTIFICATION   Patient Details  Name: Shane Gomez MRN: [id number] Date of Birth: 05-12-1945   Medicare Observation Status Notification Given:  Yes    Ninfa Meeker, RN 01/31/2016, 5:05 PM

## 2016-01-31 NOTE — Progress Notes (Signed)
   Shane Gomez presented for a [name] today.  No immediate complications.  Stress imaging is pending at this time.  Reino Bellis, NP 01/31/2016, 10:57 AM

## 2016-01-31 NOTE — Progress Notes (Signed)
PROGRESS NOTE    Shane Gomez  [id number] DOB: Dec 16, 1945 DOA: 01/30/2016 PCP: No PCP Per Patient   Outpatient Specialists:     Brief Narrative:  Shane Gomez is a 70 y.o. male with medical history significant of HTN.  Patient presents to the ED at [facility] this evening with c/o 1 week history of progressive DOE and SOB.  Patient denies peripheral edema.  Has h/o HTN but admits to not taking any meds for this.  He dosent have a PCP.  No h/o chest pain.  Uses 2 pillows to sleep.  Former smoker quit 30 years ago.   Assessment & Plan:   Principal Problem:   New onset of congestive heart failure (HCC) Active Problems:   LBBB (left bundle branch block)   NSVT (nonsustained ventricular tachycardia) (HCC)   HTN (hypertension)   Shortness of breath   New onset CHF - -lasix -echo pending -s/p stress test  LBBB and reported 8 beat run of NSVT in ED - Echo pending -magnesium ordered  HTN - Start lisinopril 5mg  PO daily Needs PCP follow up for lab work.   Alcohol abuse  -CIWA protocol   DVT prophylaxis:  Lovenox   Code Status: Full Code   Family Communication: patient  Disposition Plan:     Consultants:   cards     Subjective: Drinks 6 pack of beer/day  Objective: Vitals:   01/31/16 1051 01/31/16 1053 01/31/16 1055 01/31/16 1056  BP: 137/73 129/72 128/75   Pulse: 83 82 81 83  Resp:      Temp:      TempSrc:      SpO2:      Weight:      Height:        Intake/Output Summary (Last 24 hours) at 01/31/16 1159 Last data filed at 01/30/16 2000  Gross per 24 hour  Intake              355 ml  Output                0 ml  Net              355 ml   Filed Weights   01/30/16 1311 01/30/16 2000 01/31/16 0612  Weight: 86.2 kg (190 lb) 85.8 kg (189 lb 1.6 oz) 85.7 kg (188 lb 14.4 oz)    Examination:  General exam: Appears calm and comfortable  Respiratory system: Clear to auscultation. Respiratory effort normal. Cardiovascular system: S1 & S2 heard,  RRR. No JVD, murmurs, rubs, gallops or clicks. No pedal edema. Gastrointestinal system: Abdomen is nondistended, soft and nontender. No organomegaly or masses felt. Normal bowel sounds heard.     Data Reviewed: I have personally reviewed following labs and imaging studies  CBC:  Recent Labs Lab 01/30/16 1415  WBC 8.1  HGB 12.4*  HCT 38.1*  MCV 90.3  PLT A999333   Basic Metabolic Panel:  Recent Labs Lab 01/30/16 1415 01/31/16 0459  NA 137 139  K 3.7 3.9  CL 104 103  CO2 24 26  GLUCOSE 97 105*  BUN 12 8  CREATININE 0.96 1.12  CALCIUM 9.3 9.1   GFR: Estimated Creatinine Clearance: 68.2 mL/min (by C-G formula based on SCr of 1.12 mg/dL). Liver Function Tests: No results for input(s): AST, ALT, ALKPHOS, BILITOT, PROT, ALBUMIN in the last 168 hours. No results for input(s): LIPASE, AMYLASE in the last 168 hours. No results for input(s): AMMONIA in the last 168 hours. Coagulation Profile: No results for  input(s): INR, PROTIME in the last 168 hours. Cardiac Enzymes:  Recent Labs Lab 01/30/16 1415 01/30/16 1620  TROPONINI 0.04* 0.04*   BNP (last 3 results) No results for input(s): PROBNP in the last 8760 hours. HbA1C: No results for input(s): HGBA1C in the last 72 hours. CBG: No results for input(s): GLUCAP in the last 168 hours. Lipid Profile:  Recent Labs  01/31/16 0909  CHOL 282*  HDL 50  LDLCALC 191*  TRIG 207*  CHOLHDL 5.6   Thyroid Function Tests: No results for input(s): TSH, T4TOTAL, FREET4, T3FREE, THYROIDAB in the last 72 hours. Anemia Panel: No results for input(s): VITAMINB12, FOLATE, FERRITIN, TIBC, IRON, RETICCTPCT in the last 72 hours. Urine analysis: No results found for: COLORURINE, APPEARANCEUR, LABSPEC, [place], GLUCOSEU, HGBUR, BILIRUBINUR, KETONESUR, PROTEINUR, UROBILINOGEN, NITRITE, LEUKOCYTESUR   )No results found for this or any previous visit (from the past 240 hour(s)).    Anti-infectives    None       Radiology  Studies: Dg Chest 2 View  Result Date: 01/30/2016 CLINICAL DATA:  Short of breath EXAM: CHEST  2 VIEW COMPARISON:  10/26/2015 FINDINGS: Cardiac enlargement. Increased interstitial markings in the bases show mild progression from the prior study. Small bilateral pleural effusions. Mild right lower lobe airspace disease has progressed. IMPRESSION: Interstitial lung markings have progressed in the interval with small bilateral effusions. This may reflect mild fluid overload. Electronically Signed   By: Franchot Gallo M.D.   On: 01/30/2016 13:47   Ct Angio Chest Pe W Or Wo Contrast  Result Date: 01/30/2016 CLINICAL DATA:  Shortness of breath with exertion for 1 week EXAM: CT ANGIOGRAPHY CHEST WITH CONTRAST TECHNIQUE: Multidetector CT imaging of the chest was performed using the standard protocol during bolus administration of intravenous contrast. Multiplanar CT image reconstructions and MIPs were obtained to evaluate the vascular anatomy. CONTRAST:  100 mL Isovue 370. COMPARISON:  Plain film from earlier in the same day FINDINGS: Cardiovascular: Thoracic aorta shows atherosclerotic calcifications without aneurysmal dilatation. The degree of opacification is minimal precluding evaluation for possible dissection. Heavy coronary calcifications are seen. The pulmonary artery is well visualized and demonstrates a normal branching pattern. No filling defects to suggest pulmonary emboli are identified. Mediastinum/Nodes: Small right peritracheal node is noted measuring 10 mm in short axis. No other significant hilar or mediastinal adenopathy is seen. Lungs/Pleura: Small bilateral pleural effusions are noted right slightly greater than left. Some patchy areas of subpleural infiltrate are noted bilaterally. A small 6 mm nodule is noted in the right upper lobe medially best seen on image number 23 of series 5. A 12 mm sub solid density is noted in the left upper lobe best seen on image number 48 of series 5. Mild  scarring is noted in the right middle lobe. Mild interstitial thickening is noted throughout both lungs which may represent a a mild degree of interstitial edema. Upper Abdomen: No acute abnormality. Musculoskeletal: Degenerative changes of thoracic spine are noted. Review of the MIP images confirms the above findings. IMPRESSION: Small bilateral pleural effusions. No evidence of pulmonary emboli. Subpleural infiltrative changes seen bilaterally but particularly on the left. 6 mm nodule in the right upper lobe medially and sub solid left upper lobe nodule measuring 12 mm. Follow-up non-contrast CT recommended at 3-6 months to confirm persistence. If unchanged, and solid component remains <6 mm, annual CT is recommended until 5 years of stability has been established. If persistent these nodules should be considered highly suspicious if the solid component of the  nodule is 6 mm or greater in size and enlarging. This recommendation follows the consensus statement: Guidelines for Management of Incidental Pulmonary Nodules Detected on CT Images: From the Fleischner Society 2017; Radiology 2017; 284:228-243. Mild interstitial edema. Electronically Signed   By: Inez Catalina M.D.   On: 01/30/2016 15:24        Scheduled Meds: . regadenoson      . carvedilol  3.125 mg Oral BID WC  . enoxaparin (LOVENOX) injection  40 mg Subcutaneous Q24H  . folic acid  1 mg Oral Daily  . furosemide  40 mg Intravenous Once  . lisinopril  5 mg Oral Daily  . LORazepam  0-4 mg Oral Q6H   Followed by  . [START ON 02/02/2016] LORazepam  0-4 mg Oral Q12H  . multivitamin with minerals  1 tablet Oral Daily  . sodium chloride flush  3 mL Intravenous Q12H  . thiamine  100 mg Oral Daily   Or  . thiamine  100 mg Intravenous Daily   Continuous Infusions:    LOS: 0 days    Time spent: 25 min    [place], DO [facility] Pager [phone]  If 7PM-7AM, please contact night-coverage [email or web address] Password  [email or web address] 01/31/2016, 11:59 AM

## 2016-01-31 NOTE — Consult Note (Signed)
CARDIOLOGY CONSULT NOTE   Patient ID: Shane Gomez MRN: [id number] DOB/AGE: 1946-03-08 70 y.o.  Admit date: 01/30/2016  Primary Physician   No PCP Per Patient Primary Cardiologist  New Reason for Consultation   CHF and LBBB Requesting Physician  Dr. Eliseo Squires  HPI: Shane Gomez is a 70 y.o. male with a history of Hypertension however not taking any medication for the past 3 years who presented 01/30/16 for evaluation of dyspnea on exertion for the past 7 days. It occurs with minimal activity now. Patient denies any chest tightness or chest pain. He denies orthopnea, PND, lower extremity edema, abdominal pain/tightness,, lightheadedness. He endorsed to having loss of appetite for the past one month fatigue. He uses 2 pillows to sleep chronically. Former smoker quit 30 years ago. He has been drinking 3-4 cases of beer every night. No family history of premature coronary heart disease. Never evaluated by cardiologists in the past. Hasn't follow-up with PCP since moving to [place] from [place] 3 years ago. Blood pressure has been running in the 150s over 80s at home.  Up on presentation BP of 165/113. EKG shows sinus rhythm at a rate of 94 bpm and left bundle branch block. No prior EKG to compare. Troponin 0.04 x 2. BNP 918. Chest x-ray shows mild interstitial edema. CT angiogram of chest without PE however showed mild edema with pulmonary nodules and heavy calcification of coronary arteries. Telemetry shows sinus rhythm with a rate mostly in 90s and frequent PVCs. Nonsustained VT x 1 for 8 beats. On IV Lasix 20 mg twice a day. Net I & O +355.  Past Medical History:  Diagnosis Date  . Hypertension      Past Surgical History:  Procedure Laterality Date  . EYE SURGERY      No Known Allergies  I have reviewed the patient's current medications . enoxaparin (LOVENOX) injection  40 mg Subcutaneous Q24H  . furosemide  20 mg Intravenous Daily  . lisinopril  5 mg Oral Daily  . sodium chloride  flush  3 mL Intravenous Q12H     sodium chloride, acetaminophen, ondansetron (ZOFRAN) IV, sodium chloride flush  Prior to Admission medications   Medication Sig Start Date End Date Taking? Authorizing Provider  Multiple Vitamin (MULTIVITAMIN WITH MINERALS) TABS tablet Take 1 tablet by mouth daily.   Yes Historical Provider, MD  omega-3 acid ethyl esters (LOVAZA) 1 g capsule Take 1 g by mouth daily.   Yes Historical Provider, MD     Social History   Social History  . Marital status: Single    Spouse name: N/A  . Number of children: N/A  . Years of education: N/A   Occupational History  . Not on file.   Social History Main Topics  . Smoking status: Former Research scientist (life sciences)  . Smokeless tobacco: Never Used  . Alcohol use Yes     Comment: occ  . Drug use: No  . Sexual activity: Not on file   Other Topics Concern  . Not on file   Social History Narrative  . No narrative on file    Family Status  Relation Status  . Mother Deceased  . Father Deceased  . Brother Deceased  . Neg Hx    Family History  Problem Relation Age of Onset  . Heart failure Neg Hx       ROS:  Full 14 point review of systems complete and found to be negative unless listed above.  Physical Exam: Blood pressure (!) 142/88, pulse 84,  temperature 98.1 F (36.7 C), temperature source Oral, resp. rate 17, height 5\' 11"  (1.803 m), weight 188 lb 14.4 oz (85.7 kg), SpO2 96 %.  General: Well developed, well nourished, male in no acute distress Head: Eyes PERRLA, No xanthomas. Normocephalic and atraumatic, oropharynx without edema or exudate.  Lungs: Resp regular and unlabored, CTA. Heart: RRR no s3, s4, or murmurs..   Neck: No carotid bruits. No lymphadenopathy.  No JVD. Abdomen: Bowel sounds present, abdomen soft and non-tender without masses or hernias noted. Msk:  No spine or cva tenderness. No weakness, no joint deformities or effusions. Extremities: No clubbing, cyanosis or edema. DP/PT/Radials 2+ and equal  bilaterally. Neuro: Alert and oriented X 3. No focal deficits noted. Psych:  Good affect, responds appropriately Skin: No rashes or lesions noted.  Labs:   Lab Results  Component Value Date   WBC 8.1 01/30/2016   HGB 12.4 (L) 01/30/2016   HCT 38.1 (L) 01/30/2016   MCV 90.3 01/30/2016   PLT 287 01/30/2016   No results for input(s): INR in the last 72 hours.  Recent Labs Lab 01/31/16 0459  NA 139  K 3.9  CL 103  CO2 26  BUN 8  CREATININE 1.12  CALCIUM 9.1  GLUCOSE 105*   No results found for: MG  Recent Labs  01/30/16 1415 01/30/16 1620  TROPONINI 0.04* 0.04*   No results for input(s): TROPIPOC in the last 72 hours. No results found for: PROBNP No results found for: CHOL, HDL, LDLCALC, TRIG No results found for: DDIMER Lipase  Date/Time Value Ref Range Status  10/26/2015 07:59 PM 28 11 - 51 U/L Final   No results found for: TSH, T4TOTAL, T3FREE, THYROIDAB No results found for: VITAMINB12, FOLATE, FERRITIN, TIBC, IRON, RETICCTPCT  Echo: Pending   Radiology:  Dg Chest 2 View  Result Date: 01/30/2016 CLINICAL DATA:  Short of breath EXAM: CHEST  2 VIEW COMPARISON:  10/26/2015 FINDINGS: Cardiac enlargement. Increased interstitial markings in the bases show mild progression from the prior study. Small bilateral pleural effusions. Mild right lower lobe airspace disease has progressed. IMPRESSION: Interstitial lung markings have progressed in the interval with small bilateral effusions. This may reflect mild fluid overload. Electronically Signed   By: Franchot Gallo M.D.   On: 01/30/2016 13:47   Ct Angio Chest Pe W Or Wo Contrast  Result Date: 01/30/2016 CLINICAL DATA:  Shortness of breath with exertion for 1 week EXAM: CT ANGIOGRAPHY CHEST WITH CONTRAST TECHNIQUE: Multidetector CT imaging of the chest was performed using the standard protocol during bolus administration of intravenous contrast. Multiplanar CT image reconstructions and MIPs were obtained to evaluate  the vascular anatomy. CONTRAST:  100 mL Isovue 370. COMPARISON:  Plain film from earlier in the same day FINDINGS: Cardiovascular: Thoracic aorta shows atherosclerotic calcifications without aneurysmal dilatation. The degree of opacification is minimal precluding evaluation for possible dissection. Heavy coronary calcifications are seen. The pulmonary artery is well visualized and demonstrates a normal branching pattern. No filling defects to suggest pulmonary emboli are identified. Mediastinum/Nodes: Small right peritracheal node is noted measuring 10 mm in short axis. No other significant hilar or mediastinal adenopathy is seen. Lungs/Pleura: Small bilateral pleural effusions are noted right slightly greater than left. Some patchy areas of subpleural infiltrate are noted bilaterally. A small 6 mm nodule is noted in the right upper lobe medially best seen on image number 23 of series 5. A 12 mm sub solid density is noted in the left upper lobe best seen on image  number 48 of series 5. Mild scarring is noted in the right middle lobe. Mild interstitial thickening is noted throughout both lungs which may represent a a mild degree of interstitial edema. Upper Abdomen: No acute abnormality. Musculoskeletal: Degenerative changes of thoracic spine are noted. Review of the MIP images confirms the above findings. IMPRESSION: Small bilateral pleural effusions. No evidence of pulmonary emboli. Subpleural infiltrative changes seen bilaterally but particularly on the left. 6 mm nodule in the right upper lobe medially and sub solid left upper lobe nodule measuring 12 mm. Follow-up non-contrast CT recommended at 3-6 months to confirm persistence. If unchanged, and solid component remains <6 mm, annual CT is recommended until 5 years of stability has been established. If persistent these nodules should be considered highly suspicious if the solid component of the nodule is 6 mm or greater in size and enlarging. This recommendation  follows the consensus statement: Guidelines for Management of Incidental Pulmonary Nodules Detected on CT Images: From the Fleischner Society 2017; Radiology 2017; 284:228-243. Mild interstitial edema. Electronically Signed   By: Inez Catalina M.D.   On: 01/30/2016 15:24    ASSESSMENT AND PLAN:     1. New onset of congestive heart failure (HCC) - BNP 918.  Mild interstitial edema on chest x-ray and CT angio of chest. On IV Lasix 20 mg twice a day. Will give IV lasix 40mg  this PM. Reassess in AM. Net I & O +355. He does not look volume overloaded on exam. Suspects alcohol induced cardiomyopathy given severe history of alcohol drinking every night vs hypertensive cardiomyopathy. Pending echocardiogram.  2. LBBB (left bundle branch block) - Unknown duration. He does have a coronary calcifications on CT angiogram of the chest. Warrants ischemic evaluation with recent dyspnea on exertion and fatigue. Myoview today.   3.  NSVT (nonsustained ventricular tachycardia) (HCC) - 8 beats yesterday. K normal.   4.  HTN (hypertension) - Uncontrolled. Lisinopril 5mg  added to regimen. Will add coreg 3.125mg  BID.   5. Incidental finding of lung nodules - Per primary  6. Elevated troponin - Troponin of 0.04 x2. Likely demand.    SignedLeanor Kail, Hecker 01/31/2016, 8:02 AM Pager [phone]  Co-Sign MD

## 2016-02-01 DIAGNOSIS — I5041 Acute combined systolic (congestive) and diastolic (congestive) heart failure: Secondary | ICD-10-CM

## 2016-02-01 DIAGNOSIS — I472 Ventricular tachycardia: Secondary | ICD-10-CM

## 2016-02-01 DIAGNOSIS — F1010 Alcohol abuse, uncomplicated: Secondary | ICD-10-CM

## 2016-02-01 LAB — BASIC METABOLIC PANEL
Anion gap: 8 (ref 5–15)
BUN: 16 mg/dL (ref 6–20)
CHLORIDE: 100 mmol/L — AB (ref 101–111)
CO2: 30 mmol/L (ref 22–32)
CREATININE: 1.19 mg/dL (ref 0.61–1.24)
Calcium: 9.3 mg/dL (ref 8.9–10.3)
GFR calc Af Amer: >60 mL/min (ref >60)
GFR calc non Af Amer: >60 mL/min (ref >60)
GLUCOSE: 115 mg/dL — AB (ref 65–99)
POTASSIUM: 4.3 mmol/L (ref 3.5–5.1)
SODIUM: 138 mmol/L (ref 135–145)

## 2016-02-01 MED ORDER — FUROSEMIDE 20 MG PO TABS: 20.0000 mg | ORAL_TABLET | Freq: Every day | ORAL | 0 refills | Status: DC | PRN

## 2016-02-01 MED ORDER — LISINOPRIL 5 MG PO TABS: 5.0000 mg | ORAL_TABLET | Freq: Every day | ORAL | 0 refills | Status: DC

## 2016-02-01 MED ORDER — ATORVASTATIN CALCIUM 40 MG PO TABS: 40.0000 mg | ORAL_TABLET | Freq: Every day | ORAL | 0 refills | Status: DC

## 2016-02-01 MED ORDER — ATORVASTATIN CALCIUM 40 MG PO TABS: 40.0000 mg | ORAL_TABLET | Freq: Every day | ORAL | Status: DC

## 2016-02-01 MED ORDER — CARVEDILOL 3.125 MG PO TABS: 3.1250 mg | ORAL_TABLET | Freq: Two times a day (BID) | ORAL | 0 refills | Status: DC

## 2016-02-01 MED ORDER — LORAZEPAM 1 MG PO TABS: 0.0000 mg | ORAL_TABLET | Freq: Two times a day (BID) | ORAL | Status: DC

## 2016-02-01 MED ORDER — LORAZEPAM 1 MG PO TABS: 0.0000 mg | ORAL_TABLET | Freq: Four times a day (QID) | ORAL | Status: DC

## 2016-02-01 NOTE — Discharge Summary (Signed)
Physician Discharge Summary  Shane Gomez [id number] DOB: Nov 24, 1945 DOA: 01/30/2016  PCP: No PCP Per Patient  Admit date: 01/30/2016 Discharge date: 02/01/2016   Recommendations for Outpatient Follow-Up:   1. Alcohol cessation 2. LFTs, FLP 6 weeks 3. Outpatient echo 3 months 4. Establish with PCP-- call # on back of insurance card   Discharge Diagnosis:   Principal Problem:   New onset of congestive heart failure ([place]) Active Problems:   LBBB (left bundle branch block)   NSVT (nonsustained ventricular tachycardia) (HCC)   HTN (hypertension)   Shortness of breath   CHF exacerbation ([place])   Discharge disposition:  Home.  SNF:  Discharge Condition: Improved.  Diet recommendation: Low sodium, heart healthy.  Carbohydrate-modified.  Regular.  Wound care: None.   History of Present Illness:   Shane Gomez is a 70 y.o. male with medical history significant of HTN.  Patient presents to the ED at [facility] this evening with c/o 1 week history of progressive DOE and SOB.  Patient denies peripheral edema.  Has h/o HTN but admits to not taking any meds for this.  He dosent have a PCP.  No h/o chest pain.  Uses 2 pillows to sleep.  Former smoker quit 30 years ago.  ED Course: Work up suggestive of new onset CHF, patient with LBBB on EKG no prior available for comparison.  Trop 0.04 x2.  BNP elevated.  Given lasix 20mg  with good diuresis.  Has 8 beat run of V tach on monitor.   Hospital Course by Problem:   Acute systolic CHF -most likely due to alcohol abuse -lasix PRN -echo shows EF 20-25% -s/p stress test- high risk due to EF but no reversible defects  LBBB and reported 8 beat run of NSVT in ED - -magnesium ok  HTN /HLD -meds started -patient to follow up with PCP for repeat labs               Alcohol abuse             -encouraged cessation    Medical Consultants:   cards  Discharge Exam:   Vitals:   02/01/16 0741 02/01/16 0830  BP: 122/75 116/81    Pulse: 75 81  Resp: 18   Temp: 97.9 F (36.6 C)    Vitals:   01/31/16 1932 02/01/16 0423 02/01/16 0741 02/01/16 0830  BP: 105/64 103/70 122/75 116/81  Pulse: 80 80 75 81  Resp: 17  18   Temp: 97.7 F (36.5 C) 98.3 F (36.8 C) 97.9 F (36.6 C)   TempSrc: Oral Oral Oral   SpO2: 92% 95% 94%   Weight:  85.4 kg (188 lb 3.2 oz)    Height:        Gen:  NAD   The results of significant diagnostics from this hospitalization (including imaging, microbiology, ancillary and laboratory) are listed below for reference.     Procedures and Diagnostic Studies:   Dg Chest 2 View  Result Date: 01/30/2016 CLINICAL DATA:  Short of breath EXAM: CHEST  2 VIEW COMPARISON:  10/26/2015 FINDINGS: Cardiac enlargement. Increased interstitial markings in the bases show mild progression from the prior study. Small bilateral pleural effusions. Mild right lower lobe airspace disease has progressed. IMPRESSION: Interstitial lung markings have progressed in the interval with small bilateral effusions. This may reflect mild fluid overload. Electronically Signed   By: Franchot Gallo M.D.   On: 01/30/2016 13:47   Ct Angio Chest Pe W Or Wo Contrast  Result Date:  01/30/2016 CLINICAL DATA:  Shortness of breath with exertion for 1 week EXAM: CT ANGIOGRAPHY CHEST WITH CONTRAST TECHNIQUE: Multidetector CT imaging of the chest was performed using the standard protocol during bolus administration of intravenous contrast. Multiplanar CT image reconstructions and MIPs were obtained to evaluate the vascular anatomy. CONTRAST:  100 mL Isovue 370. COMPARISON:  Plain film from earlier in the same day FINDINGS: Cardiovascular: Thoracic aorta shows atherosclerotic calcifications without aneurysmal dilatation. The degree of opacification is minimal precluding evaluation for possible dissection. Heavy coronary calcifications are seen. The pulmonary artery is well visualized and demonstrates a normal branching pattern. No filling  defects to suggest pulmonary emboli are identified. Mediastinum/Nodes: Small right peritracheal node is noted measuring 10 mm in short axis. No other significant hilar or mediastinal adenopathy is seen. Lungs/Pleura: Small bilateral pleural effusions are noted right slightly greater than left. Some patchy areas of subpleural infiltrate are noted bilaterally. A small 6 mm nodule is noted in the right upper lobe medially best seen on image number 23 of series 5. A 12 mm sub solid density is noted in the left upper lobe best seen on image number 48 of series 5. Mild scarring is noted in the right middle lobe. Mild interstitial thickening is noted throughout both lungs which may represent a a mild degree of interstitial edema. Upper Abdomen: No acute abnormality. Musculoskeletal: Degenerative changes of thoracic spine are noted. Review of the MIP images confirms the above findings. IMPRESSION: Small bilateral pleural effusions. No evidence of pulmonary emboli. Subpleural infiltrative changes seen bilaterally but particularly on the left. 6 mm nodule in the right upper lobe medially and sub solid left upper lobe nodule measuring 12 mm. Follow-up non-contrast CT recommended at 3-6 months to confirm persistence. If unchanged, and solid component remains <6 mm, annual CT is recommended until 5 years of stability has been established. If persistent these nodules should be considered highly suspicious if the solid component of the nodule is 6 mm or greater in size and enlarging. This recommendation follows the consensus statement: Guidelines for Management of Incidental Pulmonary Nodules Detected on CT Images: From the Fleischner Society 2017; Radiology 2017; 284:228-243. Mild interstitial edema. Electronically Signed   By: Inez Catalina M.D.   On: 01/30/2016 15:24   Nm Myocar Multi W/spect W/wall Motion / Ef  Result Date: 01/31/2016 CLINICAL DATA:  Left bundle-branch block. EXAM: MYOCARDIAL IMAGING WITH SPECT (REST AND  PHARMACOLOGIC-STRESS) GATED LEFT VENTRICULAR WALL MOTION STUDY LEFT VENTRICULAR EJECTION FRACTION TECHNIQUE: Standard myocardial SPECT imaging was performed after resting intravenous injection of 10 mCi Tc-59m tetrofosmin. Subsequently, intravenous infusion of Lexiscan was performed under the supervision of the Cardiology staff. At peak effect of the drug, 30 mCi Tc-81m tetrofosmin was injected intravenously and standard myocardial SPECT imaging was performed. Quantitative gated imaging was also performed to evaluate left ventricular wall motion, and estimate left ventricular ejection fraction. COMPARISON:  None. FINDINGS: Perfusion: There is a fixed defects noted along the inferior wall. No reversible defects noted Wall Motion: Generalized hypokinesia. Left Ventricular Ejection Fraction: 23 % End diastolic volume [id number] ml End systolic volume [id number] ml IMPRESSION: 1. Fixed defect in the inferior wall, likely old infarct/scar. 2. Generalized hypokinesia. 3. Left ventricular ejection fraction 23% 4. Non invasive risk stratification*: High *2012 Appropriate Use Criteria for Coronary Revascularization Focused Update: J Am Coll Cardiol. [id number]. http://content.[email or web address].aspx?articleid=[email or web address] Electronically Signed   By: Rolm Baptise M.D.   On: 01/31/2016 16:14     Labs:   Basic Metabolic Panel:  Recent Labs  Lab 01/30/16 1415 01/31/16 0459 01/31/16 0909 02/01/16 0525  NA 137 139  --  138  K 3.7 3.9  --  4.3  CL 104 103  --  100*  CO2 24 26  --  30  GLUCOSE 97 105*  --  115*  BUN 12 8  --  16  CREATININE 0.96 1.12  --  1.19  CALCIUM 9.3 9.1  --  9.3  MG  --   --  2.3  --    GFR Estimated Creatinine Clearance: 64.2 mL/min (by C-G formula based on SCr of 1.19 mg/dL). Liver Function Tests: No results for input(s): AST, ALT, ALKPHOS, BILITOT, PROT, ALBUMIN in the last 168 hours. No results for input(s): LIPASE, AMYLASE in the last 168 hours. No results for input(s): AMMONIA in  the last 168 hours. Coagulation profile No results for input(s): INR, PROTIME in the last 168 hours.  CBC:  Recent Labs Lab 01/30/16 1415  WBC 8.1  HGB 12.4*  HCT 38.1*  MCV 90.3  PLT 287   Cardiac Enzymes:  Recent Labs Lab 01/30/16 1415 01/30/16 1620  TROPONINI 0.04* 0.04*   BNP: Invalid input(s): POCBNP CBG: No results for input(s): GLUCAP in the last 168 hours. D-Dimer No results for input(s): DDIMER in the last 72 hours. Hgb A1c No results for input(s): HGBA1C in the last 72 hours. Lipid Profile  Recent Labs  01/31/16 0909  CHOL 282*  HDL 50  LDLCALC 191*  TRIG 207*  CHOLHDL 5.6   Thyroid function studies No results for input(s): TSH, T4TOTAL, T3FREE, THYROIDAB in the last 72 hours.  Invalid input(s): FREET3 Anemia work up No results for input(s): VITAMINB12, FOLATE, FERRITIN, TIBC, IRON, RETICCTPCT in the last 72 hours. Microbiology No results found for this or any previous visit (from the past 240 hour(s)).   Discharge Instructions:   Discharge Instructions    Diet - low sodium heart healthy    Complete by:  As directed    Discharge instructions    Complete by:  As directed    Alcohol cessation LFTs, FLP 6 weeks Outpatient echo 3 months Establish with PCP-- call # on back of insurance card   Increase activity slowly    Complete by:  As directed        Medication List    TAKE these medications   atorvastatin 40 MG tablet Commonly known as:  LIPITOR Take 1 tablet (40 mg total) by mouth daily at 6 PM.   carvedilol 3.125 MG tablet Commonly known as:  COREG Take 1 tablet (3.125 mg total) by mouth 2 (two) times daily with a meal.   furosemide 20 MG tablet Commonly known as:  LASIX Take 1 tablet (20 mg total) by mouth daily as needed for fluid or edema.   lisinopril 5 MG tablet Commonly known as:  PRINIVIL,ZESTRIL Take 1 tablet (5 mg total) by mouth daily.   multivitamin with minerals Tabs tablet Take 1 tablet by mouth daily.     omega-3 acid ethyl esters 1 g capsule Commonly known as:  LOVAZA Take 1 g by mouth daily.      Follow-up Information    Skeet Latch, MD. Go on 02/15/2016.   Specialty:  Cardiology Why:  @9am  for post hospital Contact information: [address] [place] [address] [phone]            Time coordinating discharge: 35 min  Signed:  Randell Detter Alison Stalling   [facility] 02/01/2016, 10:08 AM

## 2016-02-01 NOTE — Discharge Instructions (Signed)
Heart Failure °Heart failure is a condition in which the heart has trouble pumping blood. This means your heart does not pump blood efficiently for your body to work well. In some cases of heart failure, fluid may back up into your lungs or you may have swelling (edema) in your lower legs. Heart failure is usually a long-term (chronic) condition. It is important for you to take good care of yourself and follow your health care provider's treatment plan. °CAUSES  °Some health conditions can cause heart failure. Those health conditions include: °· High blood pressure (hypertension). Hypertension causes the heart muscle to work harder than normal. When pressure in the blood vessels is high, the heart needs to pump (contract) with more force in order to circulate blood throughout the body. High blood pressure eventually causes the heart to become stiff and weak. °· Coronary artery disease (CAD). CAD is the buildup of cholesterol and fat (plaque) in the arteries of the heart. The blockage in the arteries deprives the heart muscle of oxygen and blood. This can cause chest pain and may lead to a heart attack. High blood pressure can also contribute to CAD. °· Heart attack (myocardial infarction). A heart attack occurs when one or more arteries in the heart become blocked. The loss of oxygen damages the muscle tissue of the heart. When this happens, part of the heart muscle dies. The injured tissue does not contract as well and weakens the heart's ability to pump blood. °· Abnormal heart valves. When the heart valves do not open and close properly, it can cause heart failure. This makes the heart muscle pump harder to keep the blood flowing. °· Heart muscle disease (cardiomyopathy or myocarditis). Heart muscle disease is damage to the heart muscle from a variety of causes. These can include drug or alcohol abuse, infections, or unknown reasons. These can increase the risk of heart failure. °· Lung disease. Lung disease  makes the heart work harder because the lungs do not work properly. This can cause a strain on the heart, leading it to fail. °· Diabetes. Diabetes increases the risk of heart failure. High blood sugar contributes to high fat (lipid) levels in the blood. Diabetes can also cause slow damage to tiny blood vessels that carry important nutrients to the heart muscle. When the heart does not get enough oxygen and food, it can cause the heart to become weak and stiff. This leads to a heart that does not contract efficiently. °· Other conditions can contribute to heart failure. These include abnormal heart rhythms, thyroid problems, and low blood counts (anemia). °Certain unhealthy behaviors can increase the risk of heart failure, including: °· Being overweight. °· Smoking or chewing tobacco. °· Eating foods high in fat and cholesterol. °· Abusing illicit drugs or alcohol. °· Lacking physical activity. °SYMPTOMS  °Heart failure symptoms may vary and can be hard to detect. Symptoms may include: °· Shortness of breath with activity, such as climbing stairs. °· Persistent cough. °· Swelling of the feet, ankles, legs, or abdomen. °· Unexplained weight gain. °· Difficulty breathing when lying flat (orthopnea). °· Waking from sleep because of the need to sit up and get more air. °· Rapid heartbeat. °· Fatigue and loss of energy. °· Feeling light-headed, dizzy, or close to fainting. °· Loss of appetite. °· Nausea. °· Increased urination during the night (nocturia). °DIAGNOSIS  °A diagnosis of heart failure is based on your history, symptoms, physical examination, and diagnostic tests. Diagnostic tests for heart failure may include: °·   Echocardiography. °· Electrocardiography. °· Chest X-ray. °· Blood tests. °· Exercise stress test. °· Cardiac angiography. °· Radionuclide scans. °TREATMENT  °Treatment is aimed at managing the symptoms of heart failure. Medicines, behavioral changes, or surgical intervention may be necessary to  treat heart failure. °· Medicines to help treat heart failure may include: °¨ Angiotensin-converting enzyme (ACE) inhibitors. This type of medicine blocks the effects of a blood protein called angiotensin-converting enzyme. ACE inhibitors relax (dilate) the blood vessels and help lower blood pressure. °¨ Angiotensin receptor blockers (ARBs). This type of medicine blocks the actions of a blood protein called angiotensin. Angiotensin receptor blockers dilate the blood vessels and help lower blood pressure. °¨ Water pills (diuretics). Diuretics cause the kidneys to remove salt and water from the blood. The extra fluid is removed through urination. This loss of extra fluid lowers the volume of blood the heart pumps. °¨ Beta blockers. These prevent the heart from beating too fast and improve heart muscle strength. °¨ Digitalis. This increases the force of the heartbeat. °· Healthy behavior changes include: °¨ Obtaining and maintaining a healthy weight. °¨ Stopping smoking or chewing tobacco. °¨ Eating heart-healthy foods. °¨ Limiting or avoiding alcohol. °¨ Stopping illicit drug use. °¨ Physical activity as directed by your health care provider. °· Surgical treatment for heart failure may include: °¨ A procedure to open blocked arteries, repair damaged heart valves, or remove damaged heart muscle tissue. °¨ A pacemaker to improve heart muscle function and control certain abnormal heart rhythms. °¨ An internal cardioverter defibrillator to treat certain serious abnormal heart rhythms. °¨ A left ventricular assist device (LVAD) to assist the pumping ability of the heart. °HOME CARE INSTRUCTIONS  °· Take medicines only as directed by your health care provider. Medicines are important in reducing the workload of your heart, slowing the progression of heart failure, and improving your symptoms. °¨ Do not stop taking your medicine unless directed by your health care provider. °¨ Do not skip any dose of medicine. °¨ Refill your  prescriptions before you run out of medicine. Your medicines are needed every day. °· Engage in moderate physical activity if directed by your health care provider. Moderate physical activity can benefit some people. The elderly and people with severe heart failure should consult with a health care provider for physical activity recommendations. °· Eat heart-healthy foods. Food choices should be free of trans fat and low in saturated fat, cholesterol, and salt (sodium). Healthy choices include fresh or frozen fruits and vegetables, fish, lean meats, legumes, fat-free or low-fat dairy products, and whole grain or high fiber foods. Talk to a dietitian to learn more about heart-healthy foods. °· Limit sodium if directed by your health care provider. Sodium restriction may reduce symptoms of heart failure in some people. Talk to a dietitian to learn more about heart-healthy seasonings. °· Use healthy cooking methods. Healthy cooking methods include roasting, grilling, broiling, baking, poaching, steaming, or stir-frying. Talk to a dietitian to learn more about healthy cooking methods. °· Limit fluids if directed by your health care provider. Fluid restriction may reduce symptoms of heart failure in some people. °· Weigh yourself every day. Daily weights are important in the early recognition of excess fluid. You should weigh yourself every morning after you urinate and before you eat breakfast. Wear the same amount of clothing each time you weigh yourself. Record your daily weight. Provide your health care provider with your weight record. °· Monitor and record your blood pressure if directed by your health care   provider.  Check your pulse if directed by your health care provider.  Lose weight if directed by your health care provider. Weight loss may reduce symptoms of heart failure in some people.  Stop smoking or chewing tobacco. Nicotine makes your heart work harder by causing your blood vessels to constrict.  Do not use nicotine gum or patches before talking to your health care provider.  Keep all follow-up visits as directed by your health care provider. This is important.  Limit alcohol intake to no more than 1 drink per day for nonpregnant women and 2 drinks per day for men. One drink equals 12 ounces of beer, 5 ounces of wine, or 1 ounces of hard liquor. Drinking more than that is harmful to your heart. Tell your health care provider if you drink alcohol several times a week. Talk with your health care provider about whether alcohol is safe for you. If your heart has already been damaged by alcohol or you have severe heart failure, drinking alcohol should be stopped completely.  Stop illicit drug use.  Stay up-to-date with immunizations. It is especially important to prevent respiratory infections through current pneumococcal and influenza immunizations.  Manage other health conditions such as hypertension, diabetes, thyroid disease, or abnormal heart rhythms as directed by your health care provider.  Learn to manage stress.  Plan rest periods when fatigued.  Learn strategies to manage high temperatures. If the weather is extremely hot:  Avoid vigorous physical activity.  Use air conditioning or fans or seek a cooler location.  Avoid caffeine and alcohol.  Wear loose-fitting, lightweight, and light-colored clothing.  Learn strategies to manage cold temperatures. If the weather is extremely cold:  Avoid vigorous physical activity.  Layer clothes.  Wear mittens or gloves, a hat, and a scarf when going outside.  Avoid alcohol.  Obtain ongoing education and support as needed.  Participate in or seek rehabilitation as needed to maintain or improve independence and quality of life. SEEK MEDICAL CARE IF:   You have a rapid weight gain.  You have increasing shortness of breath that is unusual for you.  You are unable to participate in your usual physical activities.  You tire  easily.  You cough more than normal, especially with physical activity.  You have any or more swelling in areas such as your hands, feet, ankles, or abdomen.  You are unable to sleep because it is hard to breathe.  You feel like your heart is beating fast (palpitations).  You become dizzy or light-headed upon standing up. SEEK IMMEDIATE MEDICAL CARE IF:   You have difficulty breathing.  There is a change in mental status such as decreased alertness or difficulty with concentration.  You have a pain or discomfort in your chest.  You have an episode of fainting (syncope). MAKE SURE YOU:   Understand these instructions.  Will watch your condition.  Will get help right away if you are not doing well or get worse.   This information is not intended to replace advice given to you by your health care provider. Make sure you discuss any questions you have with your health care provider.   Document Released: 03/18/2005 Document Revised: 08/02/2014 Document Reviewed: 04/17/2012 Elsevier Interactive Patient Education 2016 [employer].   Shortness of Breath Shortness of breath means you have trouble breathing. It could also mean that you have a medical problem. You should get immediate medical care for shortness of breath. CAUSES   Not enough oxygen in the air such  as with high altitudes or a smoke-filled room.  Certain lung diseases, infections, or problems.  Heart disease or conditions, such as angina or heart failure.  Low red blood cells (anemia).  Poor physical fitness, which can cause shortness of breath when you exercise.  Chest or back injuries or stiffness.  Being overweight.  Smoking.  Anxiety, which can make you feel like you are not getting enough air. DIAGNOSIS  Serious medical problems can often be found during your physical exam. Tests may also be done to determine why you are having shortness of breath. Tests may include:  Chest X-rays.  Lung function  tests.  Blood tests.  An electrocardiogram (ECG).  An ambulatory electrocardiogram. An ambulatory ECG records your heartbeat patterns over a 24-hour period.  Exercise testing.  A transthoracic echocardiogram (TTE). During echocardiography, sound waves are used to evaluate how blood flows through your heart.  A transesophageal echocardiogram (TEE).  Imaging scans. Your health care provider may not be able to find a cause for your shortness of breath after your exam. In this case, it is important to have a follow-up exam with your health care provider as directed.  TREATMENT  Treatment for shortness of breath depends on the cause of your symptoms and can vary greatly. HOME CARE INSTRUCTIONS   Do not smoke. Smoking is a common cause of shortness of breath. If you smoke, ask for help to quit.  Avoid being around chemicals or things that may bother your breathing, such as paint fumes and dust.  Rest as needed. Slowly resume your usual activities.  If medicines were prescribed, take them as directed for the full length of time directed. This includes oxygen and any inhaled medicines.  Keep all follow-up appointments as directed by your health care provider. SEEK MEDICAL CARE IF:   Your condition does not improve in the time expected.  You have a hard time doing your normal activities even with rest.  You have any new symptoms. SEEK IMMEDIATE MEDICAL CARE IF:   Your shortness of breath gets worse.  You feel light-headed, faint, or develop a cough not controlled with medicines.  You start coughing up blood.  You have pain with breathing.  You have chest pain or pain in your arms, shoulders, or abdomen.  You have a fever.  You are unable to walk up stairs or exercise the way you normally do. MAKE SURE YOU:  Understand these instructions.  Will watch your condition.  Will get help right away if you are not doing well or get worse.   This information is not intended to  replace advice given to you by your health care provider. Make sure you discuss any questions you have with your health care provider.   Document Released: 12/11/2000 Document Revised: 03/23/2013 Document Reviewed: 06/03/2011 Elsevier Interactive Patient Education [employer].

## 2016-02-01 NOTE — Care Management Note (Signed)
Case Management Note  Patient Details  Name: Harvest Bosman MRN: [id number] Date of Birth: [birth year]  Subjective/Objective:   Pt presented for CHF. Plan will be to return home. Pt has [employer] and no PCP. [facility] Number provided to patient and patient will call to establish PCP.                  Action/Plan: No further  Home needs identified at this time.   Expected Discharge Date:                  Expected Discharge Plan:  Home/Self Care  In-House Referral:  NA  Discharge planning Services  CM Consult  Post Acute Care Choice:  NA Choice offered to:  NA  DME Arranged:  N/A DME Agency:  NA  HH Arranged:  NA HH Agency:  NA  Status of Service:  Completed, signed off  If discussed at [place] of Stay Meetings, dates discussed:    Additional Comments:  Bethena Roys, RN 02/01/2016, 10:50 AM

## 2016-02-01 NOTE — Progress Notes (Signed)
Patient Name: Shane Gomez Date of Encounter: 02/01/2016  Primary Cardiologist: Dr. Oval Linsey [facility] Problem List     Principal Problem:   New onset of congestive heart failure (HCC) Active Problems:   LBBB (left bundle branch block)   NSVT (nonsustained ventricular tachycardia) (HCC)   HTN (hypertension)   Shortness of breath   CHF exacerbation (HCC)     Subjective   Resolved dyspnea. No chest pain.   Inpatient Medications    Scheduled Meds: . atorvastatin  40 mg Oral q1800  . carvedilol  3.125 mg Oral BID WC  . enoxaparin (LOVENOX) injection  40 mg Subcutaneous Q24H  . folic acid  1 mg Oral Daily  . lisinopril  5 mg Oral Daily  . LORazepam  0-4 mg Oral Q6H   Followed by  . [START ON 02/02/2016] LORazepam  0-4 mg Oral Q12H  . multivitamin with minerals  1 tablet Oral Daily  . sodium chloride flush  3 mL Intravenous Q12H  . thiamine  100 mg Oral Daily   Continuous Infusions:   PRN Meds: sodium chloride, acetaminophen, LORazepam **OR** LORazepam, ondansetron (ZOFRAN) IV, sodium chloride flush   Vital Signs    Vitals:   01/31/16 1757 01/31/16 1932 02/01/16 0423 02/01/16 0741  BP: (!) 113/58 105/64 103/70 122/75  Pulse: 86 80 80 75  Resp:  17  18  Temp:  97.7 F (36.5 C) 98.3 F (36.8 C) 97.9 F (36.6 C)  TempSrc:  Oral Oral Oral  SpO2:  92% 95% 94%  Weight:   188 lb 3.2 oz (85.4 kg)   Height:        Intake/Output Summary (Last 24 hours) at 02/01/16 0833 Last data filed at 01/31/16 1951  Gross per 24 hour  Intake              476 ml  Output                0 ml  Net              476 ml   Filed Weights   01/30/16 2000 01/31/16 0612 02/01/16 0423  Weight: 189 lb 1.6 oz (85.8 kg) 188 lb 14.4 oz (85.7 kg) 188 lb 3.2 oz (85.4 kg)    Physical Exam    GEN: Well nourished, well developed, in no acute distress.  HEENT: Grossly normal.  Neck: Supple, no JVD, carotid bruits, or masses. Cardiac: RRR, no murmurs, rubs, or gallops. No clubbing,  cyanosis, edema.  Radials/DP/PT 2+ and equal bilaterally.  Respiratory:  Respirations regular and unlabored, clear to auscultation bilaterally. GI: Soft, nontender, nondistended, BS + x 4. MS: no deformity or atrophy. Skin: warm and dry, no rash. Neuro:  Strength and sensation are intact. Psych: AAOx3.  Normal affect.  Labs    CBC  Recent Labs  01/30/16 1415  WBC 8.1  HGB 12.4*  HCT 38.1*  MCV 90.3  PLT [id number]   Basic Metabolic Panel  Recent Labs  01/31/16 0459 01/31/16 0909 02/01/16 0525  NA 139  --  138  K 3.9  --  4.3  CL 103  --  100*  CO2 26  --  30  GLUCOSE 105*  --  115*  BUN 8  --  16  CREATININE 1.12  --  1.19  CALCIUM 9.1  --  9.3  MG  --  2.3  --    Liver Function Tests No results for input(s): AST, ALT, ALKPHOS, BILITOT, PROT, ALBUMIN in  the last 72 hours. No results for input(s): LIPASE, AMYLASE in the last 72 hours. Cardiac Enzymes  Recent Labs  01/30/16 1415 01/30/16 1620  TROPONINI 0.04* 0.04*   BNP Invalid input(s): POCBNP D-Dimer No results for input(s): DDIMER in the last 72 hours. Hemoglobin A1C No results for input(s): HGBA1C in the last 72 hours. Fasting Lipid Panel  Recent Labs  01/31/16 0909  CHOL 282*  HDL 50  LDLCALC 191*  TRIG 207*  CHOLHDL 5.6   Thyroid Function Tests No results for input(s): TSH, T4TOTAL, T3FREE, THYROIDAB in the last 72 hours.  Invalid input(s): FREET3  Telemetry    Sinus rhythm at rate of 60s and PVCs - Personally Reviewed  ECG    N/A  Radiology    Dg Chest 2 View  Result Date: 01/30/2016 CLINICAL DATA:  Short of breath EXAM: CHEST  2 VIEW COMPARISON:  10/26/2015 FINDINGS: Cardiac enlargement. Increased interstitial markings in the bases show mild progression from the prior study. Small bilateral pleural effusions. Mild right lower lobe airspace disease has progressed. IMPRESSION: Interstitial lung markings have progressed in the interval with small bilateral effusions. This may reflect  mild fluid overload. Electronically Signed   By: Franchot Gallo M.D.   On: 01/30/2016 13:47   Ct Angio Chest Pe W Or Wo Contrast  Result Date: 01/30/2016 CLINICAL DATA:  Shortness of breath with exertion for 1 week EXAM: CT ANGIOGRAPHY CHEST WITH CONTRAST TECHNIQUE: Multidetector CT imaging of the chest was performed using the standard protocol during bolus administration of intravenous contrast. Multiplanar CT image reconstructions and MIPs were obtained to evaluate the vascular anatomy. CONTRAST:  100 mL Isovue 370. COMPARISON:  Plain film from earlier in the same day FINDINGS: Cardiovascular: Thoracic aorta shows atherosclerotic calcifications without aneurysmal dilatation. The degree of opacification is minimal precluding evaluation for possible dissection. Heavy coronary calcifications are seen. The pulmonary artery is well visualized and demonstrates a normal branching pattern. No filling defects to suggest pulmonary emboli are identified. Mediastinum/Nodes: Small right peritracheal node is noted measuring 10 mm in short axis. No other significant hilar or mediastinal adenopathy is seen. Lungs/Pleura: Small bilateral pleural effusions are noted right slightly greater than left. Some patchy areas of subpleural infiltrate are noted bilaterally. A small 6 mm nodule is noted in the right upper lobe medially best seen on image number 23 of series 5. A 12 mm sub solid density is noted in the left upper lobe best seen on image number 48 of series 5. Mild scarring is noted in the right middle lobe. Mild interstitial thickening is noted throughout both lungs which may represent a a mild degree of interstitial edema. Upper Abdomen: No acute abnormality. Musculoskeletal: Degenerative changes of thoracic spine are noted. Review of the MIP images confirms the above findings. IMPRESSION: Small bilateral pleural effusions. No evidence of pulmonary emboli. Subpleural infiltrative changes seen bilaterally but particularly  on the left. 6 mm nodule in the right upper lobe medially and sub solid left upper lobe nodule measuring 12 mm. Follow-up non-contrast CT recommended at 3-6 months to confirm persistence. If unchanged, and solid component remains <6 mm, annual CT is recommended until 5 years of stability has been established. If persistent these nodules should be considered highly suspicious if the solid component of the nodule is 6 mm or greater in size and enlarging. This recommendation follows the consensus statement: Guidelines for Management of Incidental Pulmonary Nodules Detected on CT Images: From the Fleischner Society 2017; Radiology 2017; 284:228-243. Mild interstitial edema.  Electronically Signed   By: Inez Catalina M.D.   On: 01/30/2016 15:24   Nm Myocar Multi W/spect W/wall Motion / Ef  Result Date: 01/31/2016 CLINICAL DATA:  Left bundle-branch block. EXAM: MYOCARDIAL IMAGING WITH SPECT (REST AND PHARMACOLOGIC-STRESS) GATED LEFT VENTRICULAR WALL MOTION STUDY LEFT VENTRICULAR EJECTION FRACTION TECHNIQUE: Standard myocardial SPECT imaging was performed after resting intravenous injection of 10 mCi Tc-4m tetrofosmin. Subsequently, intravenous infusion of Lexiscan was performed under the supervision of the Cardiology staff. At peak effect of the drug, 30 mCi Tc-43m tetrofosmin was injected intravenously and standard myocardial SPECT imaging was performed. Quantitative gated imaging was also performed to evaluate left ventricular wall motion, and estimate left ventricular ejection fraction. COMPARISON:  None. FINDINGS: Perfusion: There is a fixed defects noted along the inferior wall. No reversible defects noted Wall Motion: Generalized hypokinesia. Left Ventricular Ejection Fraction: 23 % End diastolic volume A999333 ml End systolic volume Q000111Q ml IMPRESSION: 1. Fixed defect in the inferior wall, likely old infarct/scar. 2. Generalized hypokinesia. 3. Left ventricular ejection fraction 23% 4. Non invasive risk  stratification*: High *2012 Appropriate Use Criteria for Coronary Revascularization Focused Update: J Am Coll Cardiol. [id number]. http://content.airportbarriers.com.aspx?articleid=[email or web address] Electronically Signed   By: Rolm Baptise M.D.   On: 01/31/2016 16:14    Cardiac Studies   Echo 01/31/16 LV EF: 20% -   25%  ------------------------------------------------------------------- Indications:      CHF - 428.0.  ------------------------------------------------------------------- History:   Risk factors:  Former tobacco use. Hypertension.  ------------------------------------------------------------------- Study Conclusions  - Left ventricle: The cavity size was normal. Systolic function was   severely reduced. The estimated ejection fraction was in the   range of 20% to 25%. Diffuse hypokinesis. Features are consistent   with a pseudonormal left ventricular filling pattern, with   concomitant abnormal relaxation and increased filling pressure   (grade 2 diastolic dysfunction). Global longitudinal strain:   -11.3% - Mitral valve: Calcified annulus. There was trivial regurgitation. - Left atrium: The atrium was mildly dilated.   Myoview 01/31/16 FINDINGS: Perfusion: There is a fixed defects noted along the inferior wall. No reversible defects noted  Wall Motion: Generalized hypokinesia.  Left Ventricular Ejection Fraction: 23 %  End diastolic volume A999333 ml  End systolic volume Q000111Q ml  IMPRESSION: 1. Fixed defect in the inferior wall, likely old infarct/scar.  2. Generalized hypokinesia.  3. Left ventricular ejection fraction 23%  4. Non invasive risk stratification*: High  Patient Profile     Shane Gomez is a 70 y.o. male with a history of Hypertension however not taking any medication for the past 3 years and alcohol abuse who presented 01/30/16 for evaluation of dyspnea on exertion for the past 7 days  Assessment & Plan    1. Acute combined  CHF - BNP 918.  Mild interstitial edema on chest x-ray and CT angio of chest. Net I & O positive 831cc. However weight down 2lb. Given IV lasix 20mg  x 2 and IV lasix 40mg  x1. He appears euvolemic. No need for diuretics.  - Echo showed LV EF of 20-25%, diffuse hypokinesis, grade 2 DD. Stress test was high risk due to low EF. No reversible defect. Fixed defect in the inferior wall, likely old infarct/scar.   Suspects alcohol induced cardiomyopathy  vs hypertensive cardiomyopathy. - Continue Coreg 3.125mg  and lisinopril 5mg . Will need repeat echo in 3 months. If still low EF, consider further evaluation. Try PRN lasix po 20mg  for dyspnea. Discussed lifestyle modification in details.   2. LBBB (left bundle branch  block) - Unknown duration. He does have a coronary calcifications on CT angiogram of the chest. Myoview as above.  - Continue ASA and statin.   3.  NSVT (nonsustained ventricular tachycardia) (HCC) - 8 beats 01/30/16.No further reoccurrence. Electrolytes normal.   4.  HTN (hypertension) -Improved. Stable on current regimen.   5. Incidental finding of lung nodules - Per primary  6. Elevated troponin - Troponin of 0.04 x2. Likely demand. As above  7. Alcohol abuse - advised cessation. CIWA protocol.   8. HLD - 01/31/2016: Cholesterol 282; HDL 50; LDL Cholesterol 191; Triglycerides 207; VLDL 41  - Started Lipitor 40mg . Consider OP f/u labs 6-8 weeks given statin initiation this admission.   Jarrett Soho, PA  02/01/2016, 8:33 AM

## 2016-02-15 ENCOUNTER — Encounter: Payer: Self-pay | Admitting: Cardiovascular Disease

## 2016-02-15 ENCOUNTER — Ambulatory Visit (INDEPENDENT_AMBULATORY_CARE_PROVIDER_SITE_OTHER): Payer: Medicare Other | Admitting: Cardiovascular Disease

## 2016-02-15 VITALS — BP 132/69 | HR 72 | Ht 71.0 in | Wt 182.0 lb

## 2016-02-15 DIAGNOSIS — I10 Essential (primary) hypertension: Secondary | ICD-10-CM | POA: Diagnosis not present

## 2016-02-15 DIAGNOSIS — I5042 Chronic combined systolic (congestive) and diastolic (congestive) heart failure: Secondary | ICD-10-CM

## 2016-02-15 DIAGNOSIS — I472 Ventricular tachycardia: Secondary | ICD-10-CM

## 2016-02-15 DIAGNOSIS — E7800 Pure hypercholesterolemia, unspecified: Secondary | ICD-10-CM

## 2016-02-15 DIAGNOSIS — I5041 Acute combined systolic (congestive) and diastolic (congestive) heart failure: Secondary | ICD-10-CM | POA: Diagnosis not present

## 2016-02-15 DIAGNOSIS — I110 Hypertensive heart disease with heart failure: Secondary | ICD-10-CM

## 2016-02-15 DIAGNOSIS — I4729 Other ventricular tachycardia: Secondary | ICD-10-CM

## 2016-02-15 MED ORDER — CARVEDILOL 6.25 MG PO TABS: 6.2500 mg | ORAL_TABLET | Freq: Two times a day (BID) | ORAL | 3 refills | Status: DC

## 2016-02-15 NOTE — Patient Instructions (Addendum)
Your physician has recommended you make the following change in your medication:   1.) the carvedilol has been increased to 6.25 mg twice a day. You may take (2) of your 3.125 mg tablets twice a day until completed then start new prescription as ordered.   Your physician has requested that you have an echocardiogram. Echocardiography is a painless test that uses sound waves to create images of your heart. It provides your doctor with information about the size and shape of your heart and how well your heart's chambers and valves are working. This procedure takes approximately one hour. There are no restrictions for this procedure. This will be done in February 2018.  Your physician recommends that you schedule a follow-up appointment in: February 2018.

## 2016-02-15 NOTE — Progress Notes (Signed)
Cardiology Office Note   Date:  02/17/2016   ID:  Shane Gomez, DOB [birth year], MRN [id number]  PCP:  No PCP Per Patient  Cardiologist:   Skeet Latch, MD   Chief Complaint  Patient presents with  . Follow-up    ph has no complaints today, post hospital      History of Present Illness: Shane Gomez is a 70 y.o. male with chronic systolic and diastolic heart failure, alcohol abuse, hypertension, LBBB, and NSVT who presents for hospital follow up.  Shane Gomez was seen in the emergency department 12/2015 with chest pain and shortness of breath.  Troponin was mildly elevated to 0.04. He had an echo that revealed LVEF 20-25% with diffuse hypokinesis. There is also grade 2 diastolic dysfunction.  He had a 7 beat run of NSVT that was asymptomatic. He had a nuclear stress test that admission that revealed LVEF 23% and no ischemia. There was a fixed inferior wall defect thought to be prior infarct. His heart failure was felt to be due to alcohol abuse. He endorsed drinking 6 shots of hard liquor every evening.  He was started on carvedilol, lasix, lisinopril and atorvastatin that admission.  Since discharge Shane Gomez has been doing well.  He denies any chest pain, shortness of breath, lower extremity edema, orthopnea or PND.  Initially he felt lightheadedness with positional changes, but that has improved.  He has also reduced his drinks to four each night.   Past Medical History:  Diagnosis Date  . Hyperlipidemia 02/17/2016  . Hypertension   . Hypertensive heart disease 01/30/2016    Past Surgical History:  Procedure Laterality Date  . EYE SURGERY       Current Outpatient Prescriptions  Medication Sig Dispense Refill  . atorvastatin (LIPITOR) 40 MG tablet Take 1 tablet (40 mg total) by mouth daily at 6 PM. 30 tablet 0  . furosemide (LASIX) 20 MG tablet Take 1 tablet (20 mg total) by mouth daily as needed for fluid or edema. 30 tablet 0  . lisinopril (PRINIVIL,ZESTRIL) 5 MG  tablet Take 1 tablet (5 mg total) by mouth daily. 30 tablet 0  . Multiple Vitamin (MULTIVITAMIN WITH MINERALS) TABS tablet Take 1 tablet by mouth daily.    Marland Kitchen omega-3 acid ethyl esters (LOVAZA) 1 g capsule Take 1 g by mouth daily.    . carvedilol (COREG) 6.25 MG tablet Take 1 tablet (6.25 mg total) by mouth 2 (two) times daily. 180 tablet 3   No current facility-administered medications for this visit.     Allergies:   Patient has no known allergies.    Social History:  The patient  reports that he has quit smoking. He has never used smokeless tobacco. He reports that he drinks alcohol. He reports that he does not use drugs.   Family History:  The patient's family history is not on file.    ROS:  Please see the history of present illness.   Otherwise, review of systems are positive for none.   All other systems are reviewed and negative.    PHYSICAL EXAM: VS:  BP 132/69 (BP Location: Right Arm, Patient Position: Sitting, Cuff Size: Normal)   Pulse 72   Ht 5\' 11"  (1.803 m)   Wt 82.6 kg (182 lb)   SpO2 98%   BMI 25.38 kg/m  , BMI Body mass index is 25.38 kg/m. GENERAL:  Well appearing HEENT:  Pupils equal round and reactive, fundi not visualized, oral mucosa unremarkable NECK:  No jugular venous distention, waveform within normal limits, carotid upstroke brisk and symmetric, no bruits, no thyromegaly LYMPHATICS:  No cervical adenopathy LUNGS:  Clear to auscultation bilaterally HEART:  RRR.  PMI not displaced or sustained,S1 and S2 within normal limits, no S3, no S4, no clicks, no rubs, no murmurs ABD:  Flat, positive bowel sounds normal in frequency in pitch, no bruits, no rebound, no guarding, no midline pulsatile mass, no hepatomegaly, no splenomegaly EXT:  2 plus pulses throughout, no edema, no cyanosis no clubbing SKIN:  No rashes no nodules NEURO:  Cranial nerves II through XII grossly intact, motor grossly intact throughout PSYCH:  Cognitively intact, oriented to person place  and time   EKG:  EKG is not ordered today.  Lexiscan Myoview 01/31/16: IMPRESSION: 1. Fixed defect in the inferior wall, likely old infarct/scar.  2. Generalized hypokinesia.  3. Left ventricular ejection fraction 23%  4. Non invasive risk stratification*: High  Echo 01/31/16: Study Conclusions  - Left ventricle: The cavity size was normal. Systolic function was   severely reduced. The estimated ejection fraction was in the   range of 20% to 25%. Diffuse hypokinesis. Features are consistent   with a pseudonormal left ventricular filling pattern, with   concomitant abnormal relaxation and increased filling pressure   (grade 2 diastolic dysfunction). Global longitudinal strain:   -11.3% - Mitral valve: Calcified annulus. There was trivial regurgitation. - Left atrium: The atrium was mildly dilated.    Recent Labs: 10/26/2015: ALT 17 01/30/2016: B Natriuretic Peptide 918.4; Hemoglobin 12.4; Platelets 287 01/31/2016: Magnesium 2.3 02/01/2016: BUN 16; Creatinine, Ser 1.19; Potassium 4.3; Sodium 138    Lipid Panel    Component Value Date/Time   CHOL 282 (H) 01/31/2016 0909   TRIG 207 (H) 01/31/2016 0909   HDL 50 01/31/2016 0909   CHOLHDL 5.6 01/31/2016 0909   VLDL 41 (H) 01/31/2016 0909   LDLCALC 191 (H) 01/31/2016 0909      Wt Readings from Last 3 Encounters:  02/15/16 82.6 kg (182 lb)  02/01/16 85.4 kg (188 lb 3.2 oz)  10/26/15 90.7 kg (200 lb)      ASSESSMENT AND PLAN:  # Chronic systolic and diastolic heart failure: # NSVT: [name] has newly diagnosed systolic and diastolic heart failure, LVEF 20-25%.  He was started on low dose carvedilol and lisinopril due to low blood pressure.  He is felt to have alcohol related cardiomyopathy.  His BP is now in the 130s so we will increase carvedilol t 6.25mg  bid.  This will also help his NSVT.  We will plan to repeat his echo 05/2016 to assess for EF improvement and need for ICD.  Continue lisinopril and lasix.   #  Alcohol abuse: Shane Gomez was congratulated on his efforts to cut back and encouraged to continue cutting back and to abstain from alcohol.  # Hyperlipidemia: Continue atorvastatin.  Repeat lipids and CMP at follow up.   Current medicines are reviewed at length with the patient today.  The patient does not have concerns regarding medicines.  The following changes have been made:  no change  Labs/ tests ordered today include:   Orders Placed This Encounter  Procedures  . ECHOCARDIOGRAM COMPLETE     Disposition:   FU with Ashly Goethe C. Oval Linsey, MD, [facility] in 3 months.     This note was written with the assistance of speech recognition software.  Please excuse any transcriptional errors.  Signed, Lecretia Buczek C. Oval Linsey, MD, [facility]  02/17/2016 8:35 PM  [facility]

## 2016-02-17 ENCOUNTER — Encounter: Payer: Self-pay | Admitting: Cardiovascular Disease

## 2016-02-17 DIAGNOSIS — I5042 Chronic combined systolic (congestive) and diastolic (congestive) heart failure: Secondary | ICD-10-CM | POA: Insufficient documentation

## 2016-02-17 DIAGNOSIS — E785 Hyperlipidemia, unspecified: Secondary | ICD-10-CM

## 2016-02-17 HISTORY — DX: Hyperlipidemia, unspecified: E78.5

## 2016-02-29 ENCOUNTER — Ambulatory Visit (INDEPENDENT_AMBULATORY_CARE_PROVIDER_SITE_OTHER): Payer: Medicare Other | Admitting: Family Medicine

## 2016-02-29 ENCOUNTER — Encounter: Payer: Self-pay | Admitting: Family Medicine

## 2016-02-29 VITALS — BP 119/58 | HR 65 | Temp 97.9°F | Ht 71.0 in | Wt 193.8 lb

## 2016-02-29 DIAGNOSIS — I10 Essential (primary) hypertension: Secondary | ICD-10-CM

## 2016-02-29 DIAGNOSIS — Z23 Encounter for immunization: Secondary | ICD-10-CM | POA: Diagnosis not present

## 2016-02-29 DIAGNOSIS — E785 Hyperlipidemia, unspecified: Secondary | ICD-10-CM

## 2016-02-29 DIAGNOSIS — Z1159 Encounter for screening for other viral diseases: Secondary | ICD-10-CM

## 2016-02-29 DIAGNOSIS — F1010 Alcohol abuse, uncomplicated: Secondary | ICD-10-CM

## 2016-02-29 DIAGNOSIS — Z8639 Personal history of other endocrine, nutritional and metabolic disease: Secondary | ICD-10-CM

## 2016-02-29 DIAGNOSIS — I5042 Chronic combined systolic (congestive) and diastolic (congestive) heart failure: Secondary | ICD-10-CM

## 2016-02-29 DIAGNOSIS — Z1211 Encounter for screening for malignant neoplasm of colon: Secondary | ICD-10-CM

## 2016-02-29 MED ORDER — LISINOPRIL 5 MG PO TABS: 5.0000 mg | ORAL_TABLET | Freq: Every day | ORAL | 1 refills | Status: DC

## 2016-02-29 MED ORDER — ATORVASTATIN CALCIUM 40 MG PO TABS: 40.0000 mg | ORAL_TABLET | Freq: Every day | ORAL | 1 refills | Status: DC

## 2016-02-29 MED ORDER — FUROSEMIDE 20 MG PO TABS: 20.0000 mg | ORAL_TABLET | Freq: Every day | ORAL | 0 refills | Status: DC | PRN

## 2016-02-29 NOTE — Progress Notes (Signed)
Chief Complaint  Patient presents with  . Establish Care       New Patient Visit SUBJECTIVE: HPI: Shane Gomez is an 70 y.o.male who is being seen for establishing care.  The patient was previously seen only by his cardiologist.  Currently on Lisinopril 5 mg daily, no issue with medication. Diet is improving, still drinking 1-2 beers daily. Does not follow with AA, no interest at this time. Has drank for "all my life". The most he was drinking was 1 case of beer daily. Also has hx of CHF and hyperlipidemia. Had not taken his meds for 3 years prior to recent admission. He does follow with Dr. Oval Linsey with the cardiology team.   He had a colonoscopy that was normal when he would turn 52. He has never had a pneumonia vaccine.  No Known Allergies  Past Medical History:  Diagnosis Date  . CHF (congestive heart failure) ([place])   . Hyperlipidemia 02/17/2016  . Hypertension   . Hypertensive heart disease 01/30/2016   Past Surgical History:  Procedure Laterality Date  . EYE SURGERY     Social History   Social History  . Marital status: Single   Social History Main Topics  . Smoking status: Former Research scientist (life sciences)  . Smokeless tobacco: Never Used  . Alcohol use Yes     Comment: occ  . Drug use: No   Family History  Problem Relation Age of Onset  . Cancer Mother   . Cancer Father   . Heart failure Neg Hx     Current Outpatient Prescriptions:  .  atorvastatin (LIPITOR) 40 MG tablet, Take 1 tablet (40 mg total) by mouth daily at 6 PM., Disp: 90 tablet, Rfl: 1 .  carvedilol (COREG) 6.25 MG tablet, Take 1 tablet (6.25 mg total) by mouth 2 (two) times daily., Disp: 180 tablet, Rfl: 3 .  furosemide (LASIX) 20 MG tablet, Take 1 tablet (20 mg total) by mouth daily as needed for fluid or edema., Disp: 90 tablet, Rfl: 0 .  lisinopril (PRINIVIL,ZESTRIL) 5 MG tablet, Take 1 tablet (5 mg total) by mouth daily., Disp: 90 tablet, Rfl: 1 .  Multiple Vitamin (MULTIVITAMIN WITH MINERALS) TABS tablet,  Take 1 tablet by mouth daily., Disp: , Rfl:  .  omega-3 acid ethyl esters (LOVAZA) 1 g capsule, Take 1 g by mouth daily., Disp: , Rfl:   ROS Cardiovascular: Denies chest pain  Respiratory: Denies dyspnea   OBJECTIVE: BP (!) 119/58   Pulse 65   Temp 97.9 F (36.6 C) (Oral)   Ht 5\' 11"  (1.803 m)   Wt 193 lb 12.8 oz (87.9 kg)   SpO2 97%   BMI 27.03 kg/m   Constitutional: -  VS reviewed -  Well developed, well nourished, appears stated age -  No apparent distress  Psychiatric: -  Oriented to person, place, and time -  Memory intact -  Affect and mood normal -  Fluent conversation, good eye contact -  Judgment and insight age appropriate  Eye: -  Conjunctivae clear, no discharge -  Pupils symmetric, round, reactive to light  ENMT: -  Oral mucosa without lesions, tongue and uvula midline    Tonsils not enlarged, no erythema, no exudate, trachea midline    Pharynx moist, no lesions, no erythema  Neck: -  No gross swelling, no palpable masses -  Thyroid midline, not enlarged, mobile, no palpable masses  Cardiovascular: -  RRR, no murmurs -  No LE edema  Respiratory: -  Normal respiratory  effort, no accessory muscle use, no retraction -  Breath sounds equal, no wheezes, no ronchi, no crackles  Neurological:  -  CN II - XII grossly intact -  DTR 2/4 patellar b/l, no clonus -  Sensation grossly intact to light touch, equal bilaterally  Musculoskeletal: -  No clubbing, no cyanosis -  Gait normal  Skin: -  No significant lesion on inspection -  Warm and dry to palpation   ASSESSMENT/PLAN: History of hyperglycemia - Plan: Hemoglobin A1c  Alcohol abuse  Essential hypertension - Plan: lisinopril (PRINIVIL,ZESTRIL) 5 MG tablet, DISCONTINUED: lisinopril (PRINIVIL,ZESTRIL) 5 MG tablet  Chronic combined systolic and diastolic heart failure (HCC) - Plan: furosemide (LASIX) 20 MG tablet, DISCONTINUED: lisinopril (PRINIVIL,ZESTRIL) 5 MG tablet  Need for hepatitis C screening test -  Plan: Hepatitis C Antibody  Special screening for malignant neoplasms, colon - Plan: Ambulatory referral to Gastroenterology  Need for prophylactic vaccination against Streptococcus pneumoniae (pneumococcus) - Plan: Pneumococcal polysaccharide vaccine 23-valent greater than or equal to 2yo subcutaneous/IM  Hyperlipidemia, unspecified hyperlipidemia type - Plan: atorvastatin (LIPITOR) 40 MG tablet  Urged to cut down on alcohol consumption, ideally to stop completely. Gave him contact information for [facility] alcoholic anonymous. He has made improvements so I will not push this too much, however I would like him to have this should he change his mind. While he does follow with cardiology, he was told to come here to have his medications filled. Thus I will see him every 6 months as long as he is well controlled. Patient should return in 3 mo for a med check and to see where he is at with alcohol cessation. The patient voiced understanding and agreement to the plan.   [place], DO 02/29/16  4:39 PM

## 2016-02-29 NOTE — Patient Instructions (Signed)
Alcoholics Anonymous in [place]: [phone] If you feel like you need a little extra help, here is some contact info.

## 2016-02-29 NOTE — Progress Notes (Signed)
Pre visit review using our clinic tool,if applicable. No additional management support is needed unless otherwise documented below in the visit note.  

## 2016-03-01 LAB — HEPATITIS C ANTIBODY: HCV AB: NEGATIVE

## 2016-03-01 LAB — HEMOGLOBIN A1C: HEMOGLOBIN A1C: 6.2 % (ref 4.6–6.5)

## 2016-03-12 ENCOUNTER — Ambulatory Visit (HOSPITAL_COMMUNITY): Payer: Medicare Other | Attending: Cardiology

## 2016-03-12 ENCOUNTER — Other Ambulatory Visit: Payer: Self-pay

## 2016-03-12 DIAGNOSIS — I5021 Acute systolic (congestive) heart failure: Secondary | ICD-10-CM | POA: Diagnosis present

## 2016-03-12 DIAGNOSIS — I5041 Acute combined systolic (congestive) and diastolic (congestive) heart failure: Secondary | ICD-10-CM | POA: Diagnosis not present

## 2016-03-12 DIAGNOSIS — I110 Hypertensive heart disease with heart failure: Secondary | ICD-10-CM | POA: Diagnosis not present

## 2016-03-12 DIAGNOSIS — I77810 Thoracic aortic ectasia: Secondary | ICD-10-CM | POA: Diagnosis not present

## 2016-03-12 DIAGNOSIS — I10 Essential (primary) hypertension: Secondary | ICD-10-CM | POA: Diagnosis not present

## 2016-03-12 DIAGNOSIS — I059 Rheumatic mitral valve disease, unspecified: Secondary | ICD-10-CM | POA: Diagnosis not present

## 2016-04-02 ENCOUNTER — Encounter: Payer: Self-pay | Admitting: Gastroenterology

## 2016-04-02 ENCOUNTER — Telehealth: Payer: Self-pay | Admitting: Family Medicine

## 2016-04-02 MED ORDER — CARVEDILOL 6.25 MG PO TABS: 6.2500 mg | ORAL_TABLET | Freq: Two times a day (BID) | ORAL | 0 refills | Status: DC

## 2016-04-02 NOTE — Telephone Encounter (Signed)
Self. Refill request for carvedilol    Pharmacy: [facility] on [address] and [facility]

## 2016-04-02 NOTE — Telephone Encounter (Signed)
I have sent into refill #180 tablets 0 refills. TL/CMA

## 2016-05-10 ENCOUNTER — Telehealth: Payer: Self-pay | Admitting: Cardiovascular Disease

## 2016-05-10 ENCOUNTER — Telehealth: Payer: Self-pay | Admitting: *Deleted

## 2016-05-10 NOTE — Telephone Encounter (Signed)
Closed encounter °

## 2016-05-10 NOTE — Telephone Encounter (Signed)
If for a screening colonoscopy then yes, clinic appointment would be preferred. Thanks

## 2016-05-10 NOTE — Telephone Encounter (Signed)
This patient has an EF of 20-25%. OV or Direct to [facility]?

## 2016-05-13 ENCOUNTER — Encounter: Payer: Self-pay | Admitting: Gastroenterology

## 2016-05-13 NOTE — Telephone Encounter (Signed)
Called patient to make Office visit with Dr.Armbruster. No answer, left message for patient to call us back.

## 2016-05-17 ENCOUNTER — Ambulatory Visit: Payer: Medicare Other | Admitting: Cardiovascular Disease

## 2016-05-24 ENCOUNTER — Telehealth: Payer: Self-pay | Admitting: *Deleted

## 2016-05-24 NOTE — Telephone Encounter (Signed)
LM for patient to return call to schedule AWV.   

## 2016-05-29 ENCOUNTER — Encounter: Payer: Self-pay | Admitting: Family Medicine

## 2016-05-29 ENCOUNTER — Ambulatory Visit (INDEPENDENT_AMBULATORY_CARE_PROVIDER_SITE_OTHER): Payer: Medicare Other | Admitting: Family Medicine

## 2016-05-29 VITALS — BP 152/66 | HR 66 | Temp 98.0°F | Ht 71.0 in | Wt 199.4 lb

## 2016-05-29 DIAGNOSIS — I5042 Chronic combined systolic (congestive) and diastolic (congestive) heart failure: Secondary | ICD-10-CM

## 2016-05-29 DIAGNOSIS — E785 Hyperlipidemia, unspecified: Secondary | ICD-10-CM | POA: Diagnosis not present

## 2016-05-29 DIAGNOSIS — I10 Essential (primary) hypertension: Secondary | ICD-10-CM

## 2016-05-29 DIAGNOSIS — R7303 Prediabetes: Secondary | ICD-10-CM

## 2016-05-29 MED ORDER — CARVEDILOL 6.25 MG PO TABS: 6.2500 mg | ORAL_TABLET | Freq: Two times a day (BID) | ORAL | 3 refills | Status: DC

## 2016-05-29 MED ORDER — FUROSEMIDE 20 MG PO TABS: 20.0000 mg | ORAL_TABLET | Freq: Every day | ORAL | 3 refills | Status: DC | PRN

## 2016-05-29 MED ORDER — LISINOPRIL 20 MG PO TABS: 20.0000 mg | ORAL_TABLET | Freq: Every day | ORAL | 1 refills | Status: DC

## 2016-05-29 MED ORDER — ATORVASTATIN CALCIUM 40 MG PO TABS: 40.0000 mg | ORAL_TABLET | Freq: Every day | ORAL | 3 refills | Status: DC

## 2016-05-29 NOTE — Progress Notes (Signed)
Chief Complaint  Patient presents with  . Follow-up    pt. here today for 3 month medication follow-up; pt. voices no complaints or concerns    Subjective Shane Gomez is a 71 y.o. male who presents for hypertension follow up. He does monitor home blood pressures. Blood pressures ranging from 150-160's/70's on average. He is compliant with medications. Patient has these side effects of medication: none He is adhering to a healthy diet overall.  Current exercise: active at work- works maintenance  Dyslipidemia Patient presents for dyslipidemia follow up. Compliance with treatment thus far has been good. He denies myalgias. He is adhering to a low sodium and low fat diet. The patient exercises never.  The patient is not known to have coexisting coronary artery disease.  Prediabetes The patient was found to have an A1c of 6.2 at his November lab draw. He states he does consume around 2 gallons of milk weekly. He also consumes 1-2 beers nightly, however he has been cutting down and is usually closer to 1 beer per day. Does not exercise routinely.   Past Medical History:  Diagnosis Date  . CHF (congestive heart failure) ([place])   . Hyperlipidemia 02/17/2016  . Hypertension    Family History  Problem Relation Age of Onset  . Cancer Mother   . Cancer Father   . Heart failure Neg Hx      Medications Current Outpatient Prescriptions on File Prior to Visit  Medication Sig Dispense Refill  . atorvastatin (LIPITOR) 40 MG tablet Take 1 tablet (40 mg total) by mouth daily at 6 PM. 90 tablet 1  . carvedilol (COREG) 6.25 MG tablet Take 1 tablet (6.25 mg total) by mouth 2 (two) times daily. 180 tablet 0  . furosemide (LASIX) 20 MG tablet Take 1 tablet (20 mg total) by mouth daily as needed for fluid or edema. 90 tablet 0  . Multiple Vitamin (MULTIVITAMIN WITH MINERALS) TABS tablet Take 1 tablet by mouth daily.    Marland Kitchen omega-3 acid ethyl esters (LOVAZA) 1 g capsule Take 1 g by mouth daily.      Allergies No Known Allergies  Review of Systems Cardiovascular: no chest pain Respiratory:  no shortness of breath  Exam BP (!) 152/66 (BP Location: Left Arm, Cuff Size: Large)   Pulse 66   Temp 98 F (36.7 C) (Oral)   Ht 5\' 11"  (1.803 m)   Wt 199 lb 6.4 oz (90.4 kg)   SpO2 98%   BMI 27.81 kg/m  General:  well developed, well nourished, in no apparent distress Skin:  warm, no pallor or diaphoresis Eyes:  pupils equal and round, sclera anicteric without injection Mouth: MMM, mucosa is pink out lesions Heart :RRR, no murmurs, no bruits, no LE edema Lungs:  clear to auscultation, no accessory muscle use Psych: well oriented with normal range of affect and appropriate judgment/insight  Essential hypertension - Plan: lisinopril (PRINIVIL,ZESTRIL) 20 MG tablet  Chronic combined systolic and diastolic heart failure (HCC) - Plan: furosemide (LASIX) 20 MG tablet  Hyperlipidemia, unspecified hyperlipidemia type - Plan: atorvastatin (LIPITOR) 40 MG tablet  Prediabetes  Orders as above. Increased dose of lisinopril from 5 mg daily to 20 mg daily. Based on his home readings, are blood pressure today is accurate. Discussed taking blood pressure home, keeping a log, and bringing his log and blood pressure monitor to his next appointment. Counseled on diet and exercise, he would likely benefit to cut down on both alcohol consumption and reducing his dairy intake. Offered  to refer to nutritionist for his prediabetes. He would like to start with the handout and increasing his exercise. F/u in 1 mo to recheck blood pressure. The patient voiced understanding and agreement to the plan.  [place], DO 05/29/16  4:34 PM

## 2016-05-29 NOTE — Progress Notes (Signed)
Pre visit review using our clinic review tool, if applicable. No additional management support is needed unless otherwise documented below in the visit note. 

## 2016-05-29 NOTE — Patient Instructions (Addendum)
Around 3 times per week, check your blood pressure 4 times per day. Twice in the morning and twice in the evening. The readings should be at least one minute apart. Write down these values and bring them to your next nurse visit/appointment.  When you check your BP, make sure you have been doing something calm/relaxing 5 minutes prior to checking. Both feet should be flat on the floor and you should be sitting. Use your left arm and make sure it is in a relaxed position (on a table), and that the cuff is at the approximate level/height of your heart.  Stop taking Lisinopril 5 mg daily. A new prescription for a new strength of Lisinopril was called in (20 mg daily).  Healthy Eating Plan Many factors influence your heart health, including eating and exercise habits. Heart (coronary) risk increases with abnormal blood fat (lipid) levels. Heart-healthy meal planning includes limiting unhealthy fats, increasing healthy fats, and making other small dietary changes. This includes maintaining a healthy body weight to help keep lipid levels within a normal range.  WHAT IS MY PLAN?  Your health care provider recommends that you:  Drink a glass of water before meals to help with satiety.  Eat slowly.  An alternative to the water is to add Metamucil. This will help with satiety as well. It does contain calories, unlike water.  WHAT TYPES OF FAT SHOULD I CHOOSE?  Choose healthy fats more often. Choose monounsaturated and polyunsaturated fats, such as olive oil and canola oil, flaxseeds, walnuts, almonds, and seeds.  Eat more omega-3 fats. Good choices include salmon, mackerel, sardines, tuna, flaxseed oil, and ground flaxseeds. Aim to eat fish at least two times each week.  Avoid foods with partially hydrogenated oils in them. These contain trans fats. Examples of foods that contain trans fats are stick margarine, some tub margarines, cookies, crackers, and other baked goods. If you are going to avoid a  fat, this is the one to avoid!  WHAT GENERAL GUIDELINES DO I NEED TO FOLLOW?  Check food labels carefully to identify foods with trans fats. Avoid these types of options when possible.  Fill one half of your plate with vegetables and green salads. Eat 4-5 servings of vegetables per day. A serving of vegetables equals 1 cup of raw leafy vegetables,  cup of raw or cooked cut-up vegetables, or  cup of vegetable juice.  Fill one fourth of your plate with whole grains. Look for the word "whole" as the first word in the ingredient list.  Fill one fourth of your plate with lean protein foods.  Eat 4-5 servings of fruit per day. A serving of fruit equals one medium whole fruit,  cup of dried fruit,  cup of fresh, frozen, or canned fruit. Try to avoid fruits in cups/syrups as the sugar content can be high.  Eat more foods that contain soluble fiber. Examples of foods that contain this type of fiber are apples, broccoli, carrots, beans, peas, and barley. Aim to get 20-30 g of fiber per day.  Eat more home-cooked food and less restaurant, buffet, and fast food.  Limit or avoid alcohol.  Limit foods that are high in starch and sugar.  Avoid fried foods when able.  Cook foods by using methods other than frying. Baking, boiling, grilling, and broiling are all great options. Other fat-reducing suggestions include: ? Removing the skin from poultry. ? Removing all visible fats from meats. ? Skimming the fat off of stews, soups, and gravies before serving  them. ? Steaming vegetables in water or broth.  Lose weight if you are overweight. Losing just 5-10% of your initial body weight can help your overall health and prevent diseases such as diabetes and heart disease.  Increase your consumption of nuts, legumes, and seeds to 4-5 servings per week. One serving of dried beans or legumes equals  cup after being cooked, one serving of nuts equals 1 ounces, and one serving of seeds equals  ounce or 1  tablespoon.  WHAT ARE GOOD FOODS CAN I EAT? Grains Grainy breads (try to find bread that is 3 g of fiber per slice or greater), oatmeal, light popcorn. Whole-grain cereals. Rice and pasta, including brown rice and those that are made with whole wheat. Edamame pasta is a great alternative to grain pasta. It has a higher protein content. Try to avoid significant consumption of white bread, sugary cereals, or pastries/baked goods.  Vegetables All vegetables. Cooked white potatoes do not count as vegetables.  Fruits All fruits, but limit pineapple and bananas as these fruits have a higher sugar content.  Meats and Other Protein Sources Lean, well-trimmed beef, veal, pork, and lamb. Chicken and [place] without skin. All fish and shellfish. Wild duck, rabbit, pheasant, and venison. Egg whites or low-cholesterol egg substitutes. Dried beans, peas, lentils, and tofu.Seeds and most nuts.  Dairy Low-fat or nonfat cheeses, including ricotta, string, and mozzarella. Skim or 1% milk that is liquid, powdered, or evaporated. Buttermilk that is made with low-fat milk. Nonfat or low-fat yogurt. Soy/Almond milk are good alternatives if you cannot handle dairy.  Beverages Water is the best for you. Sports drinks with less sugar are more desirable unless you are a highly active athlete.  Sweets and Desserts Sherbets and fruit ices. Honey, jam, marmalade, jelly, and syrups. Dark chocolate.  Eat all sweets and desserts in moderation.  Fats and Oils Nonhydrogenated (trans-free) margarines. Vegetable oils, including soybean, sesame, sunflower, olive, peanut, safflower, corn, canola, and cottonseed. Salad dressings or mayonnaise that are made with a vegetable oil. Limit added fats and oils that you use for cooking, baking, salads, and as spreads.  Other Cocoa powder. Coffee and tea. Most condiments.  The items listed above may not be a complete list of recommended foods or beverages. Contact your dietitian for  more options.

## 2016-05-30 ENCOUNTER — Telehealth: Payer: Self-pay | Admitting: Family Medicine

## 2016-05-30 NOTE — Telephone Encounter (Signed)
2 options: We have a payment card that we could try, may be too expensive still. There is an alternative medication that is on the $4 list at [facility] or [facility] that we could call in.

## 2016-05-30 NOTE — Telephone Encounter (Signed)
Caller name: Relationship to patient: Self Can be reached: [phone] Pharmacy:  [place], [place] [phone] (Phone) [phone] (Fax)     Reason for call: States he went to the pharmacy to pick up atorvastatin (LIPITOR) 40 MG tablet and he could not afford it. Plse adv

## 2016-05-31 ENCOUNTER — Encounter: Payer: Medicare Other | Admitting: Gastroenterology

## 2016-05-31 MED ORDER — LOVASTATIN 40 MG PO TABS: 40.0000 mg | ORAL_TABLET | Freq: Every day | ORAL | 5 refills | Status: DC

## 2016-05-31 NOTE — Telephone Encounter (Signed)
Patient returned call please send to [facility] [address], [place], [place] [address] [phone]

## 2016-05-31 NOTE — Telephone Encounter (Signed)
Sent. TY

## 2016-05-31 NOTE — Addendum Note (Signed)
Addended by: Ames Coupe on: 05/31/2016 05:07 PM   Modules accepted: Orders

## 2016-06-25 ENCOUNTER — Encounter: Payer: Self-pay | Admitting: Gastroenterology

## 2016-06-25 ENCOUNTER — Ambulatory Visit (INDEPENDENT_AMBULATORY_CARE_PROVIDER_SITE_OTHER): Payer: Medicare Other | Admitting: Gastroenterology

## 2016-06-25 VITALS — BP 140/70 | HR 65 | Ht 71.0 in | Wt 201.0 lb

## 2016-06-25 DIAGNOSIS — Z1211 Encounter for screening for malignant neoplasm of colon: Secondary | ICD-10-CM

## 2016-06-25 DIAGNOSIS — I5040 Unspecified combined systolic (congestive) and diastolic (congestive) heart failure: Secondary | ICD-10-CM

## 2016-06-25 MED ORDER — NA SULFATE-K SULFATE-MG SULF 17.5-3.13-1.6 GM/177ML PO SOLN: 1.0000 | Freq: Once | ORAL | 0 refills | Status: AC

## 2016-06-25 NOTE — Patient Instructions (Signed)
If you are age 69 or older, your body mass index should be between 23-30. Your Body mass index is 28.03 kg/m. If this is out of the aforementioned range listed, please consider follow up with your Primary Care Provider.  If you are age 31 or younger, your body mass index should be between 19-25. Your Body mass index is 28.03 kg/m. If this is out of the aformentioned range listed, please consider follow up with your Primary Care Provider.   You have been scheduled for a colonoscopy. Please follow written instructions given to you at your visit today.  Please pick up your prep supplies at the pharmacy within the next 1-3 days. If you use inhalers (even only as needed), please bring them with you on the day of your procedure. Your physician has requested that you go to www.startemmi.com and enter the access code given to you at your visit today. This web site gives a general overview about your procedure. However, you should still follow specific instructions given to you by our office regarding your preparation for the procedure.  We have sent the following medications to your pharmacy for you to pick up at your convenience:  Suprep  Thank you.

## 2016-06-25 NOTE — Progress Notes (Signed)
HPI :  71 y/o male here for a new patient visit with history of CHF, HTN, HLD, LBBB, and NSVT.   No prior colonoscopy. No prior colon cancer screening in general. No blood in the stools. No constipation or diarrhea at baseline. No abdominal pains. No weight loss. No FH of colon cancer  He denies shortness of breath or chest pains. Otherwise feeling well without complaints.   Mother and father had lung cancer. No FH of colon cancer.  Last echocardiogram 03/12/16 - EF 20-25%  Past Medical History:  Diagnosis Date  . CHF (congestive heart failure) ([place])   . Hyperlipidemia 02/17/2016  . Hypertension      Past Surgical History:  Procedure Laterality Date  . EYE SURGERY     Family History  Problem Relation Age of Onset  . Cancer Mother   . Cancer Father   . Heart failure Neg Hx    Social History  Substance Use Topics  . Smoking status: Former Research scientist (life sciences)  . Smokeless tobacco: Never Used  . Alcohol use Yes     Comment: occ   Current Outpatient Prescriptions  Medication Sig Dispense Refill  . carvedilol (COREG) 6.25 MG tablet Take 1 tablet (6.25 mg total) by mouth 2 (two) times daily. 180 tablet 3  . furosemide (LASIX) 20 MG tablet Take 1 tablet (20 mg total) by mouth daily as needed for fluid or edema. 90 tablet 3  . lisinopril (PRINIVIL,ZESTRIL) 20 MG tablet Take 1 tablet (20 mg total) by mouth daily. 90 tablet 1  . lovastatin (MEVACOR) 40 MG tablet Take 1 tablet (40 mg total) by mouth at bedtime. 30 tablet 5  . Multiple Vitamin (MULTIVITAMIN WITH MINERALS) TABS tablet Take 1 tablet by mouth daily.    Marland Kitchen omega-3 acid ethyl esters (LOVAZA) 1 g capsule Take 1 g by mouth daily.     No current facility-administered medications for this visit.    No Known Allergies   Review of Systems: All systems reviewed and negative except where noted in HPI.   Lab Results  Component Value Date   WBC 8.1 01/30/2016   HGB 12.4 (L) 01/30/2016   HCT 38.1 (L) 01/30/2016   MCV 90.3  01/30/2016   PLT 287 01/30/2016    Physical Exam: BP 140/70   Pulse 65   Ht 5\' 11"  (1.803 m)   Wt 201 lb (91.2 kg)   BMI 28.03 kg/m  Constitutional: Pleasant,well-developed, male in no acute distress. HEENT: Normocephalic and atraumatic. Conjunctivae are normal. No scleral icterus. Neck supple.  Cardiovascular: Normal rate, regular rhythm.  Pulmonary/chest: Effort normal and breath sounds normal. No wheezing, rales or rhonchi. Abdominal: Soft, nondistended, nontender. There are no masses palpable. No hepatomegaly. Extremities: no edema Lymphadenopathy: No cervical adenopathy noted. Neurological: Alert and oriented to person place and time. Skin: Skin is warm and dry. No rashes noted. Psychiatric: Normal mood and affect. Behavior is normal.   ASSESSMENT AND PLAN: 71 year old male with significant cardiac history as outlined above, EF 20%, presenting for routine colon cancer screening. While he is average risk for colon cancer screening he is very overdue for his initial screening. We discussed options for screening including optical colonoscopy and stool based testing. Given he is average risk for colon cancer without alarm symptoms or family history I think stool based testing would be appropriate given his cardiac history. However following this discussion he declined stool based testing and strongly desired to have a colonoscopy. I discussed the risks and benefits of colonoscopy  and anesthesia with him. Given his low ejection fraction, colonoscopy would need to be done at the hospital with anesthesia support. Following this discussion he strongly wished to have a colonoscopy. We will await the results with further recommendations.   [place], MD [facility] Pager [phone]  CC: Shelda Pal*

## 2016-06-26 ENCOUNTER — Encounter: Payer: Self-pay | Admitting: Family Medicine

## 2016-06-26 ENCOUNTER — Ambulatory Visit (INDEPENDENT_AMBULATORY_CARE_PROVIDER_SITE_OTHER): Payer: Medicare Other | Admitting: Family Medicine

## 2016-06-26 VITALS — BP 130/60 | HR 75 | Temp 97.5°F | Resp 18 | Ht 71.0 in | Wt 200.8 lb

## 2016-06-26 DIAGNOSIS — I10 Essential (primary) hypertension: Secondary | ICD-10-CM

## 2016-06-26 NOTE — Progress Notes (Signed)
Chief Complaint  Patient presents with  . Hypertension    Pt here for follow up. Pt brings machine from home and reading is 147/77 on home machine.    Subjective Shane Gomez is a 71 y.o. male who presents for hypertension follow up. He does monitor home blood pressures. Blood pressures ranging from 140-150's/80-90's on average. He is compliant with medications- Coreg 6.25 mg BID, Lisinopril 20 mg daily (increased from 5 mg daily at last appt). Patient has these side effects of medication: none He is adhering to a healthy diet overall. He has cut his alcohol consumption in half. Current exercise: Active at work   Past Medical History:  Diagnosis Date  . CHF (congestive heart failure) ([place])   . Hyperlipidemia 02/17/2016  . Hypertension    Family History  Problem Relation Age of Onset  . Cancer Mother   . Cancer Father   . Heart failure Neg Hx      Medications Current Outpatient Prescriptions on File Prior to Visit  Medication Sig Dispense Refill  . carvedilol (COREG) 6.25 MG tablet Take 1 tablet (6.25 mg total) by mouth 2 (two) times daily. 180 tablet 3  . furosemide (LASIX) 20 MG tablet Take 1 tablet (20 mg total) by mouth daily as needed for fluid or edema. 90 tablet 3  . lisinopril (PRINIVIL,ZESTRIL) 20 MG tablet Take 1 tablet (20 mg total) by mouth daily. 90 tablet 1  . lovastatin (MEVACOR) 40 MG tablet Take 1 tablet (40 mg total) by mouth at bedtime. 30 tablet 5  . Multiple Vitamin (MULTIVITAMIN WITH MINERALS) TABS tablet Take 1 tablet by mouth daily.    Marland Kitchen omega-3 acid ethyl esters (LOVAZA) 1 g capsule Take 1 g by mouth daily.     Allergies No Known Allergies  Review of Systems Cardiovascular: no chest pain Respiratory:  no shortness of breath  Exam BP 130/60   Pulse 75   Temp 97.5 F (36.4 C) (Oral)   Resp 18   Ht 5\' 11"  (1.803 m)   Wt 200 lb 12.8 oz (91.1 kg)   SpO2 95% Comment: room air  BMI 28.01 kg/m  General:  well developed, well nourished, in no  apparent distress Skin:  warm, no pallor or diaphoresis Eyes:  pupils equal and round Heart :RRR, no murmurs, no bruits, no LE edema Lungs:  clear to auscultation, no accessory muscle use Psych: well oriented with normal range of affect and appropriate judgment/insight  Essential hypertension  Status: Uncontrolled Keep medication same for now, will calibrate home BP monitor and see what his home readings truly are. He will bring is log to next appt as well. Goal BP is <150/90. Counseled on diet and exercise, excellent work with cutting down on alcohol intake. F/u in 5 weeks. The patient voiced understanding and agreement to the plan.  [place], DO 06/26/16  2:05 PM

## 2016-06-26 NOTE — Progress Notes (Signed)
Pre visit review using our clinic review tool, if applicable. No additional management support is needed unless otherwise documented below in the visit note. 

## 2016-06-26 NOTE — Patient Instructions (Addendum)
Call the [facility] and ask about calibrating your machine. Tell them that the manual reading you got in the office is lower than the one obtained directly after with your home monitor.  Write down the dates and approximate time on your blood pressure log.

## 2016-08-08 ENCOUNTER — Telehealth: Payer: Self-pay

## 2016-08-08 NOTE — Telephone Encounter (Signed)
Patient advised of time move up for 5/15 procedure.

## 2016-08-09 ENCOUNTER — Encounter (HOSPITAL_COMMUNITY): Payer: Self-pay

## 2016-08-13 ENCOUNTER — Ambulatory Visit (HOSPITAL_COMMUNITY): Payer: Medicare Other | Admitting: Certified Registered Nurse Anesthetist

## 2016-08-13 ENCOUNTER — Encounter (HOSPITAL_COMMUNITY): Payer: Self-pay | Admitting: *Deleted

## 2016-08-13 ENCOUNTER — Ambulatory Visit (HOSPITAL_COMMUNITY)
Admission: RE | Admit: 2016-08-13 | Discharge: 2016-08-13 | Disposition: A | Discharge status: Home / Self Care | Payer: Medicare Other | Source: Ambulatory Visit | Attending: Gastroenterology | Admitting: Gastroenterology

## 2016-08-13 ENCOUNTER — Encounter (HOSPITAL_COMMUNITY)
Admission: RE | Disposition: A | Discharge status: Home / Self Care | Payer: Self-pay | Source: Ambulatory Visit | Attending: Gastroenterology

## 2016-08-13 DIAGNOSIS — E785 Hyperlipidemia, unspecified: Secondary | ICD-10-CM | POA: Insufficient documentation

## 2016-08-13 DIAGNOSIS — Z1211 Encounter for screening for malignant neoplasm of colon: Secondary | ICD-10-CM | POA: Diagnosis not present

## 2016-08-13 DIAGNOSIS — K635 Polyp of colon: Secondary | ICD-10-CM

## 2016-08-13 DIAGNOSIS — K5730 Diverticulosis of large intestine without perforation or abscess without bleeding: Secondary | ICD-10-CM | POA: Diagnosis not present

## 2016-08-13 DIAGNOSIS — K648 Other hemorrhoids: Secondary | ICD-10-CM | POA: Diagnosis not present

## 2016-08-13 DIAGNOSIS — D123 Benign neoplasm of transverse colon: Secondary | ICD-10-CM | POA: Insufficient documentation

## 2016-08-13 DIAGNOSIS — Z1212 Encounter for screening for malignant neoplasm of rectum: Secondary | ICD-10-CM | POA: Diagnosis not present

## 2016-08-13 DIAGNOSIS — Z87891 Personal history of nicotine dependence: Secondary | ICD-10-CM | POA: Insufficient documentation

## 2016-08-13 DIAGNOSIS — I110 Hypertensive heart disease with heart failure: Secondary | ICD-10-CM | POA: Diagnosis not present

## 2016-08-13 DIAGNOSIS — I509 Heart failure, unspecified: Secondary | ICD-10-CM | POA: Diagnosis not present

## 2016-08-13 HISTORY — PX: COLONOSCOPY WITH PROPOFOL: SHX5780

## 2016-08-13 SURGERY — COLONOSCOPY
Anesthesia: Monitor Anesthesia Care

## 2016-08-13 SURGERY — COLONOSCOPY WITH PROPOFOL
Anesthesia: Monitor Anesthesia Care

## 2016-08-13 MED ORDER — PROPOFOL 10 MG/ML IV BOLUS
INTRAVENOUS | Status: DC | PRN
  Administered 2016-08-13: 20 mg via INTRAVENOUS
  Administered 2016-08-13: 10 mg via INTRAVENOUS
  Administered 2016-08-13: 30 mg via INTRAVENOUS
  Administered 2016-08-13 (×2): 20 mg via INTRAVENOUS
  Administered 2016-08-13 (×2): 10 mg via INTRAVENOUS
  Administered 2016-08-13: 30 mg via INTRAVENOUS
  Administered 2016-08-13: 20 mg via INTRAVENOUS

## 2016-08-13 MED ORDER — LACTATED RINGERS IV SOLN
INTRAVENOUS | Status: DC
  Administered 2016-08-13: 1000 mL via INTRAVENOUS

## 2016-08-13 MED ORDER — EPHEDRINE 5 MG/ML INJ
INTRAVENOUS | Status: AC
  Filled 2016-08-13: qty 10

## 2016-08-13 MED ORDER — LIDOCAINE 2% (20 MG/ML) 5 ML SYRINGE
INTRAMUSCULAR | Status: AC
  Filled 2016-08-13: qty 5

## 2016-08-13 MED ORDER — ONDANSETRON HCL 4 MG/2ML IJ SOLN
INTRAMUSCULAR | Status: AC
  Filled 2016-08-13: qty 2

## 2016-08-13 MED ORDER — ONDANSETRON HCL 4 MG/2ML IJ SOLN
INTRAMUSCULAR | Status: DC | PRN
  Administered 2016-08-13: 4 mg via INTRAVENOUS

## 2016-08-13 MED ORDER — SODIUM CHLORIDE 0.9 % IV SOLN: INTRAVENOUS | Status: DC

## 2016-08-13 MED ORDER — PROPOFOL 500 MG/50ML IV EMUL
INTRAVENOUS | Status: DC | PRN
  Administered 2016-08-13: 75 ug/kg/min via INTRAVENOUS

## 2016-08-13 MED ORDER — PROPOFOL 10 MG/ML IV BOLUS
INTRAVENOUS | Status: AC
  Filled 2016-08-13: qty 60

## 2016-08-13 MED ORDER — LIDOCAINE 2% (20 MG/ML) 5 ML SYRINGE
INTRAMUSCULAR | Status: DC | PRN
  Administered 2016-08-13: 100 mg via INTRAVENOUS

## 2016-08-13 SURGICAL SUPPLY — 21 items

## 2016-08-13 NOTE — Discharge Instructions (Signed)

## 2016-08-13 NOTE — Anesthesia Postprocedure Evaluation (Addendum)
Anesthesia Post Note  Patient: Shane Gomez  Procedure(s) Performed: Procedure(s) (LRB): COLONOSCOPY WITH PROPOFOL (N/A)  Patient location during evaluation: Endoscopy Anesthesia Type: MAC Level of consciousness: awake and alert Pain management: pain level controlled Vital Signs Assessment: post-procedure vital signs reviewed and stable Respiratory status: spontaneous breathing and respiratory function stable Cardiovascular status: stable Anesthetic complications: no       Last Vitals:  Vitals:   08/13/16 1200 08/13/16 1210  BP: 120/73 (!) 149/79  Pulse: 64 62  Resp: (!) 21 20  Temp:      Last Pain:  Vitals:   08/13/16 1148  TempSrc: Oral                 Corinda Ammon DANIEL

## 2016-08-13 NOTE — Interval H&P Note (Signed)
History and Physical Interval Note:  08/13/2016 10:59 AM  Shane Gomez  has presented today for surgery, with the diagnosis of Colon Cancer Screening, AH  The various methods of treatment have been discussed with the patient and family. After consideration of risks, benefits and other options for treatment, the patient has consented to  Procedure(s): COLONOSCOPY WITH PROPOFOL (N/A) as a surgical intervention .  The patient's history has been reviewed, patient examined, no change in status, stable for surgery.  I have reviewed the patient's chart and labs.  Questions were answered to the patient's satisfaction.     Renelda Loma Armbruster

## 2016-08-13 NOTE — Op Note (Signed)
[facility] Patient Name: Shane Gomez Procedure Date: 08/13/2016 MRN: [id number] Attending MD: Carlota Raspberry. Armbruster MD, MD Date of Birth: 29-Jul-1945 CSN: [id number] Age: 71 Admit Type: Outpatient Procedure:                Colonoscopy Indications:              Screening for colorectal malignant neoplasm, This                            is the patient's first colonoscopy Providers:                Carlota Raspberry. Armbruster MD, MD, Vista Lawman, RN,                            William Dalton, Technician, Tinnie Gens, Technician Referring MD:              Medicines:                Monitored Anesthesia Care Complications:            No immediate complications. Estimated blood loss:                            Minimal. Estimated Blood Loss:     Estimated blood loss was minimal. Procedure:                Pre-Anesthesia Assessment:                           - Prior to the procedure, a History and Physical                            was performed, and patient medications and                            allergies were reviewed. The patient's tolerance of                            previous anesthesia was also reviewed. The risks                            and benefits of the procedure and the sedation                            options and risks were discussed with the patient.                            All questions were answered, and informed consent                            was obtained. Prior Anticoagulants: The patient has                            taken no previous anticoagulant or antiplatelet  agents. ASA Grade Assessment: III - A patient with                            severe systemic disease. After reviewing the risks                            and benefits, the patient was deemed in                            satisfactory condition to undergo the procedure.                           After obtaining informed consent, the colonoscope                             was passed under direct vision. Throughout the                            procedure, the patient's blood pressure, pulse, and                            oxygen saturations were monitored continuously. The                            EC-[id number] ([id number]) scope was introduced through                            the anus and advanced to the the terminal ileum,                            with identification of the appendiceal orifice and                            IC valve. The colonoscopy was performed without                            difficulty. The patient tolerated the procedure                            well. The quality of the bowel preparation was                            good. The ileocecal valve, appendiceal orifice, and                            rectum were photographed. Scope In: 11:13:36 AM Scope Out: 11:39:40 AM Scope Withdrawal Time: 0 hours 22 minutes 31 seconds  Total Procedure Duration: 0 hours 26 minutes 4 seconds  Findings:      The perianal and digital rectal examinations were normal.      A 10 mm polyp was found in the transverse colon. The polyp was sessile.       The polyp was removed with a cold snare. Resection and retrieval were       complete.  A 4 mm polyp was found in the transverse colon. The polyp was sessile.       The polyp was removed with a cold snare. Resection and retrieval were       complete.      A few small-mouthed diverticula were found in the left colon.      Internal hemorrhoids were found during retroflexion.      The terminal ileum appeared normal.      There was spasm in the entire colon, which prolonged withdrawal.      The exam was otherwise without abnormality. Impression:               - One 10 mm polyp in the transverse colon, removed                            with a cold snare. Resected and retrieved.                           - One 4 mm polyp in the transverse colon, removed                            with a cold  snare. Resected and retrieved.                           - Diverticulosis in the left colon.                           - Internal hemorrhoids.                           - The examined portion of the ileum was normal.                           - Colonic spasm was appreciated during withdrawal.                           - The examination was otherwise normal. Moderate Sedation:      No moderate sedation, case performed with MAC Recommendation:           - Patient has a contact number available for                            emergencies. The signs and symptoms of potential                            delayed complications were discussed with the                            patient. Return to normal activities tomorrow.                            Written discharge instructions were provided to the                            patient.                           -  Resume previous diet.                           - Continue present medications.                           - Await pathology results.                           - Repeat colonoscopy is recommended for                            surveillance. The colonoscopy date will be                            determined after pathology results from today's                            exam become available for review.                           - No ibuprofen, naproxen, or other non-steroidal                            anti-inflammatory drugs for 2 weeks after polyp                            removal. Procedure Code(s):        --- Professional ---                           [phone], Colonoscopy, flexible; with removal of                            tumor(s), polyp(s), or other lesion(s) by snare                            technique Diagnosis Code(s):        --- Professional ---                           Z12.11, Encounter for screening for malignant                            neoplasm of colon                           D12.3, Benign neoplasm of transverse  colon (hepatic                            flexure or splenic flexure)                           K64.8, Other hemorrhoids                           K58.9, Irritable bowel syndrome without diarrhea  K57.30, Diverticulosis of large intestine without                            perforation or abscess without bleeding CPT copyright 2016 American Medical Association. All rights reserved. The codes documented in this report are preliminary and upon coder review may  be revised to meet current compliance requirements. Remo Lipps P. Armbruster MD, MD 08/13/2016 11:48:56 AM This report has been signed electronically. Number of Addenda: 0

## 2016-08-13 NOTE — Transfer of Care (Signed)
Immediate Anesthesia Transfer of Care Note  Patient: Shane Gomez  Procedure(s) Performed: Procedure(s): COLONOSCOPY WITH PROPOFOL (N/A)  Patient Location: PACU and Endoscopy Unit  Anesthesia Type:MAC  Level of Consciousness: sedated  Airway & Oxygen Therapy: Patient Spontanous Breathing and Patient connected to face mask oxygen  Post-op Assessment: Report given to RN and Post -op Vital signs reviewed and stable  Post vital signs: Reviewed and stable  Last Vitals:  Vitals:   08/13/16 1150 08/13/16 1200  BP: 121/66 120/73  Pulse: 67 64  Resp: (!) 26 (!) 21  Temp:      Last Pain:  Vitals:   08/13/16 1148  TempSrc: Oral         Complications: No apparent anesthesia complications

## 2016-08-13 NOTE — H&P (Signed)
   HPI :  71 y/o male with history of CHF, HTN, HLD, LBBB, and NSVT, here for colonoscopy for screening purposes.  No prior colonoscopy. No prior colon cancer screening in general. No blood in the stools. No bowel symptoms. No FH of colon cancer   Last echocardiogram 03/12/16 - EF 20-25%  Past Medical History:  Diagnosis Date  . CHF (congestive heart failure) ([place])   . Hyperlipidemia 02/17/2016  . Hypertension      Past Surgical History:  Procedure Laterality Date  . EYE SURGERY     Family History  Problem Relation Age of Onset  . Cancer Mother   . Cancer Father   . Heart failure Neg Hx    Social History  Substance Use Topics  . Smoking status: Former Research scientist (life sciences)  . Smokeless tobacco: Never Used  . Alcohol use Yes     Comment: occ   Current Facility-Administered Medications  Medication Dose Route Frequency Provider Last Rate Last Dose  . 0.9 %  sodium chloride infusion   Intravenous Continuous Neziah Braley, Renelda Loma, MD      . lactated ringers infusion   Intravenous Continuous Dyanne Yorks, Renelda Loma, MD 10 mL/hr at 08/13/16 1029 1,000 mL at 08/13/16 1029   No Known Allergies   Review of Systems: All systems reviewed and negative except where noted in HPI.   Lab Results  Component Value Date   WBC 8.1 01/30/2016   HGB 12.4 (L) 01/30/2016   HCT 38.1 (L) 01/30/2016   MCV 90.3 01/30/2016   PLT 287 01/30/2016    Lab Results  Component Value Date   CREATININE 1.19 02/01/2016   BUN 16 02/01/2016   NA 138 02/01/2016   K 4.3 02/01/2016   CL 100 (L) 02/01/2016   CO2 30 02/01/2016    Lab Results  Component Value Date   ALT 17 10/26/2015   AST 22 10/26/2015   ALKPHOS 53 10/26/2015   BILITOT 0.5 10/26/2015     Physical Exam: BP (!) 146/74   Temp 97.6 F (36.4 C) (Oral)   Resp 18   Ht 5\' 11"  (1.803 m)   Wt 200 lb (90.7 kg)   SpO2 96%   BMI 27.89 kg/m  Constitutional: Pleasant,well-developed, male in no acute distress. Cardiovascular: Normal rate,  regular rhythm.  Pulmonary/chest: Effort normal and breath sounds normal. No wheezing, rales or rhonchi. Abdominal: Soft, nondistended, nontender.  There are no masses palpable. No hepatomegaly. Extremities: no edema   ASSESSMENT AND PLAN: 71 y/o male here for first time colon cancer screening, with significant cardiac history as outlined above. He wished to proceed with colonoscopy after discussion of risks / benefits and options for colon cancer screening. Exam done at the hospital given low EF. He agreed with the plan.   Saco Cellar, MD [facility] Gastroenterology Pager [phone]

## 2016-08-13 NOTE — Anesthesia Preprocedure Evaluation (Addendum)
Anesthesia Evaluation  Patient identified by MRN, date of birth, ID band Patient awake    Reviewed: Allergy & Precautions, NPO status , Patient's Chart, lab work & pertinent test results  History of Anesthesia Complications Negative for: history of anesthetic complications  Airway Mallampati: II  TM Distance: >3 FB Neck ROM: Full    Dental no notable dental hx. (+) Dental Advisory Given   Pulmonary former smoker,    Pulmonary exam normal        Cardiovascular hypertension, Pt. on medications and Pt. on home beta blockers +CHF  Normal cardiovascular exam  Impressions:  - Severe global reduction in LV systolic function; grade 2   diastolic dysfunction with elevated filling pressure; moderate   LVE; mild LAE.   Neuro/Psych negative neurological ROS  negative psych ROS   GI/Hepatic Neg liver ROS,   Endo/Other  negative endocrine ROS  Renal/GU negative Renal ROS  negative genitourinary   Musculoskeletal negative musculoskeletal ROS (+)   Abdominal   Peds negative pediatric ROS (+)  Hematology negative hematology ROS (+)   Anesthesia Other Findings   Reproductive/Obstetrics negative OB ROS                            Anesthesia Physical Anesthesia Plan  ASA: III  Anesthesia Plan: MAC   Post-op Pain Management:    Induction:   Airway Management Planned: Natural Airway  Additional Equipment:   Intra-op Plan:   Post-operative Plan:   Informed Consent: I have reviewed the patients History and Physical, chart, labs and discussed the procedure including the risks, benefits and alternatives for the proposed anesthesia with the patient or authorized representative who has indicated his/her understanding and acceptance.   Dental advisory given  Plan Discussed with: CRNA and Anesthesiologist  Anesthesia Plan Comments:         Anesthesia Quick Evaluation

## 2016-08-14 ENCOUNTER — Encounter (HOSPITAL_COMMUNITY): Payer: Self-pay | Admitting: Gastroenterology

## 2016-08-15 ENCOUNTER — Encounter: Payer: Self-pay | Admitting: Gastroenterology

## 2016-08-31 NOTE — Addendum Note (Signed)
Addendum  created 08/31/16 1011 by Conny Situ, MD   Sign clinical note    

## 2016-10-29 ENCOUNTER — Other Ambulatory Visit: Payer: Self-pay | Admitting: Family Medicine

## 2016-10-29 DIAGNOSIS — I10 Essential (primary) hypertension: Secondary | ICD-10-CM

## 2016-10-30 NOTE — Telephone Encounter (Signed)
Rx's approved.  However only gave #90.  The patient needs further evaluation and/or laboratory testing before further refills are given.  Ask patient to make an appointment for this.//AB/CMA

## 2016-11-29 ENCOUNTER — Other Ambulatory Visit: Payer: Self-pay | Admitting: Family Medicine

## 2017-01-01 ENCOUNTER — Other Ambulatory Visit: Payer: Self-pay | Admitting: Family Medicine

## 2017-01-07 DIAGNOSIS — Z23 Encounter for immunization: Secondary | ICD-10-CM | POA: Diagnosis not present

## 2017-01-29 ENCOUNTER — Other Ambulatory Visit: Payer: Self-pay | Admitting: Family Medicine

## 2017-01-29 DIAGNOSIS — I5042 Chronic combined systolic (congestive) and diastolic (congestive) heart failure: Secondary | ICD-10-CM

## 2017-01-29 DIAGNOSIS — I10 Essential (primary) hypertension: Secondary | ICD-10-CM

## 2017-02-28 ENCOUNTER — Other Ambulatory Visit: Payer: Self-pay | Admitting: Family Medicine

## 2017-03-28 ENCOUNTER — Other Ambulatory Visit: Payer: Self-pay | Admitting: Family Medicine

## 2017-03-28 DIAGNOSIS — I10 Essential (primary) hypertension: Secondary | ICD-10-CM

## 2017-04-25 ENCOUNTER — Other Ambulatory Visit: Payer: Self-pay | Admitting: Family Medicine

## 2017-04-25 DIAGNOSIS — I10 Essential (primary) hypertension: Secondary | ICD-10-CM

## 2017-04-25 DIAGNOSIS — I5042 Chronic combined systolic (congestive) and diastolic (congestive) heart failure: Secondary | ICD-10-CM

## 2017-05-21 NOTE — Progress Notes (Signed)
Subjective:   Shane Gomez is a 72 y.o. male who presents for an Initial Medicare Annual Wellness Visit. The Patient was informed that the wellness visit is to identify future health risk and educate and initiate measures that can reduce risk for increased disease through the lifespan.  Still working full time.   Describes health as fair, good or great? Good  Review of Systems No ROS.  Medicare Wellness Visit. Additional risk factors are reflected in the social history.    Sleep patterns:  Wakes to urinate 2x per night. Sleeps at least 8 hrs. Feels rested. Home Safety/Smoke Alarms: Feels safe in home. Smoke alarms in place.  Living environment; residence and Firearm Safety: 1st floor apt. Lives alone.  Seat Belt Safety/Bike Helmet: Wears seat belt.  Male:   CCS- last 08/13/16: recall 3 yrs   PSA- No results found for: PSA Dentist-only as needed. Denies any issues.     Objective:    Today's Vitals   05/27/17 1313  BP: (!) 142/82  Pulse: 61  SpO2: 97%  Weight: 199 lb (90.3 kg)  Height: 5\' 11"  (1.803 m)   Body mass index is 27.75 kg/m.  Advanced Directives 05/27/2017 08/13/2016 01/30/2016 01/30/2016 10/26/2015  Does Patient Have a Medical Advance Directive? Yes No Yes No Yes  Type of Paramedic of [place];Living will - [place];Living will - [place]  Does patient want to make changes to medical advance directive? No - Patient declined - No - Patient declined - No - Patient declined  Copy of [place] in Chart? No - copy requested - No - copy requested - -  Would patient like information on creating a medical advance directive? - No - Patient declined Yes - Spiritual care consult ordered No - patient declined information -    Current Medications (verified) Outpatient Encounter Medications as of 05/27/2017  Medication Sig  . carvedilol (COREG) 6.25 MG tablet TAKE 1 TABLET(6.25 MG) BY MOUTH  TWICE DAILY  . furosemide (LASIX) 20 MG tablet TAKE 1 TABLET(20 MG) BY MOUTH DAILY AS NEEDED FOR FLUID RETENTION OR SWELLING  . lisinopril (PRINIVIL,ZESTRIL) 20 MG tablet TAKE 1 TABLET BY MOUTH DAILY  . lovastatin (MEVACOR) 40 MG tablet TAKE 1 TABLET(40 MG) BY MOUTH AT BEDTIME  . Misc Natural Products (OSTEO BI-FLEX JOINT SHIELD PO) Take 2 tablets by mouth daily.  . Multiple Vitamin (MULTIVITAMIN WITH MINERALS) TABS tablet Take 1 tablet by mouth daily.  Shane Gomez 3-6-9 Fatty Acids (OMEGA-3-6-9 PO) Take 1 capsule by mouth daily.  . [DISCONTINUED] lovastatin (MEVACOR) 40 MG tablet TAKE 1 TABLET(40 MG) BY MOUTH AT BEDTIME   No facility-administered encounter medications on file as of 05/27/2017.     Allergies (verified) Patient has no known allergies.   History: Past Medical History:  Diagnosis Date  . CHF (congestive heart failure) ([place])   . Hyperlipidemia 02/17/2016  . Hypertension    Past Surgical History:  Procedure Laterality Date  . COLONOSCOPY WITH PROPOFOL N/A 08/13/2016   Procedure: COLONOSCOPY WITH PROPOFOL;  Surgeon: Manus Gunning, MD;  Location: [facility]COPY;  Service: Gastroenterology;  Laterality: N/A;  . EYE SURGERY     Family History  Problem Relation Age of Onset  . Cancer Mother   . Cancer Father   . Heart failure Neg Hx    Social History   Socioeconomic History  . Marital status: Single    Spouse name: None  . Number of children: None  .  Years of education: None  . Highest education level: None  Social Needs  . Financial resource strain: None  . Food insecurity - worry: None  . Food insecurity - inability: None  . Transportation needs - medical: None  . Transportation needs - non-medical: None  Occupational History  . None  Tobacco Use  . Smoking status: Former Research scientist (life sciences)  . Smokeless tobacco: Never Used  Substance and Sexual Activity  . Alcohol use: Yes    Comment: 2 beers per day  . Drug use: No  . Sexual activity: None  Other Topics  Concern  . None  Social History Narrative  . None   Tobacco Counseling Counseling given: Not Answered   Clinical Intake: Pain : No/denies pain   Activities of Daily Living In your present state of health, do you have any difficulty performing the following activities: 05/27/2017  Hearing? N  Vision? N  Comment wears glasses. Eye doctor as needed. Hx cataract sx 5 yrs ago per pt.   Difficulty concentrating or making decisions? N  Walking or climbing stairs? N  Dressing or bathing? N  Doing errands, shopping? N  Preparing Food and eating ? N  Using the Toilet? N  In the past six months, have you accidently leaked urine? N  Do you have problems with loss of bowel control? N  Managing your Medications? N  Managing your Finances? N  Housekeeping or managing your Housekeeping? N  Some recent data might be hidden     Immunizations and Health Maintenance Immunization History  Administered Date(s) Administered  . Influenza-Unspecified 01/29/2016  . Pneumococcal Polysaccharide-23 02/29/2016  . Tdap 10/26/2015   Health Maintenance Due  Topic Date Due  . INFLUENZA VACCINE  10/30/2016  . PNA vac Low Risk Adult (2 of 2 - PCV13) 02/28/2017    Patient Care Team: Shelda Pal, DO as PCP - General (Family Medicine)  Indicate any recent Medical Services you may have received from other than [facility] providers in the past year (date may be approximate).    Assessment:   This is a routine wellness examination for Shane Gomez. Physical assessment deferred to PCP.  Hearing/Vision screen Hearing Screening Comments: Able to hear conversational tones w/o difficulty. No issues reported.   Vision Screening Comments: Pt reports hx cataract sx. States he wears glasses some, but they are in the car.   Dietary issues and exercise activities discussed:   Diet (meal preparation, eat out, water intake, caffeinated beverages, dairy products, fruits and vegetables): well balanced, on average,  2 meals per day   Goals    . Maintain current health      Depression Screen PHQ 2/9 Scores 05/27/2017 05/29/2016  PHQ - 2 Score 0 0    Fall Risk Fall Risk  05/27/2017 05/29/2016  Falls in the past year? No No    Cognitive Function: MMSE - Mini Mental State Exam 05/27/2017  Orientation to time 5  Orientation to Place 5  Registration 3  Attention/ Calculation 5  Recall 2  Language- name 2 objects 2  Language- repeat 1  Language- follow 3 step command 3  Language- read & follow direction 1  Write a sentence 1  Copy design 1  Total score 29        Screening Tests Health Maintenance  Topic Date Due  . INFLUENZA VACCINE  10/30/2016  . PNA vac Low Risk Adult (2 of 2 - PCV13) 02/28/2017  . TETANUS/TDAP  10/25/2025  . COLONOSCOPY  08/14/2026  . Hepatitis  C Screening  Completed       Plan:   Please schedule appt to follow up with PCP since you have not seen him in over a year.   Continue to eat heart healthy diet (full of fruits, vegetables, whole grains, lean protein, water--limit salt, fat, and sugar intake) and increase physical activity as tolerated.  Continue doing brain stimulating activities (puzzles, reading, adult coloring books, staying active) to keep memory sharp.    I have personally reviewed and noted the following in the patient's chart:   . Medical and social history . Use of alcohol, tobacco or illicit drugs  . Current medications and supplements . Functional ability and status . Nutritional status . Physical activity . Advanced directives . List of other physicians . Hospitalizations, surgeries, and ER visits in previous 12 months . Vitals . Screenings to include cognitive, depression, and falls . Referrals and appointments  In addition, I have reviewed and discussed with patient certain preventive protocols, quality metrics, and best practice recommendations. A written personalized care plan for preventive services as well as general preventive  health recommendations were provided to patient.     Naaman Plummer [place], [place]   05/27/2017

## 2017-05-27 ENCOUNTER — Other Ambulatory Visit: Payer: Self-pay | Admitting: Family Medicine

## 2017-05-27 ENCOUNTER — Encounter: Payer: Self-pay | Admitting: *Deleted

## 2017-05-27 ENCOUNTER — Ambulatory Visit (INDEPENDENT_AMBULATORY_CARE_PROVIDER_SITE_OTHER): Payer: Medicare Other | Admitting: *Deleted

## 2017-05-27 VITALS — BP 142/82 | HR 61 | Ht 71.0 in | Wt 199.0 lb

## 2017-05-27 DIAGNOSIS — Z0000 Encounter for general adult medical examination without abnormal findings: Secondary | ICD-10-CM | POA: Diagnosis not present

## 2017-05-27 DIAGNOSIS — I10 Essential (primary) hypertension: Secondary | ICD-10-CM

## 2017-05-27 NOTE — Patient Instructions (Signed)
Please schedule appt to follow up with PCP since you have not seen him in over a year.   Continue to eat heart healthy diet (full of fruits, vegetables, whole grains, lean protein, water--limit salt, fat, and sugar intake) and increase physical activity as tolerated.  Continue doing brain stimulating activities (puzzles, reading, adult coloring books, staying active) to keep memory sharp.    Shane Gomez , Thank you for taking time to come for your Medicare Wellness Visit. I appreciate your ongoing commitment to your health goals. Please review the following plan we discussed and let me know if I can assist you in the future.   These are the goals we discussed: Goals    . Maintain current health       This is a list of the screening recommended for you and due dates:  Health Maintenance  Topic Date Due  . Flu Shot  10/30/2016  . Pneumonia vaccines (2 of 2 - PCV13) 02/28/2017  . Tetanus Vaccine  10/25/2025  . Colon Cancer Screening  08/14/2026  .  Hepatitis C: One time screening is recommended by Center for Disease Control  (CDC) for  adults born from [birth year] through 1963.   Completed    Health Maintenance, Male A healthy lifestyle and preventive care is important for your health and wellness. Ask your health care provider about what schedule of regular examinations is right for you. What should I know about weight and diet? Eat a Healthy Diet  Eat plenty of vegetables, fruits, whole grains, low-fat dairy products, and lean protein.  Do not eat a lot of foods high in solid fats, added sugars, or salt.  Maintain a Healthy Weight Regular exercise can help you achieve or maintain a healthy weight. You should:  Do at least 150 minutes of exercise each week. The exercise should increase your heart rate and make you sweat (moderate-intensity exercise).  Do strength-training exercises at least twice a week.  Watch Your Levels of Cholesterol and Blood Lipids  Have your blood tested for  lipids and cholesterol every 5 years starting at 70 years of age. If you are at high risk for heart disease, you should start having your blood tested when you are 71 years old. You may need to have your cholesterol levels checked more often if: ? Your lipid or cholesterol levels are high. ? You are older than 70 years of age. ? You are at high risk for heart disease.  What should I know about cancer screening? Many types of cancers can be detected early and may often be prevented. Lung Cancer  You should be screened every year for lung cancer if: ? You are a current smoker who has smoked for at least 30 years. ? You are a former smoker who has quit within the past 15 years.  Talk to your health care provider about your screening options, when you should start screening, and how often you should be screened.  Colorectal Cancer  Routine colorectal cancer screening usually begins at 70 years of age and should be repeated every 5-10 years until you are 70 years old. You may need to be screened more often if early forms of precancerous polyps or small growths are found. Your health care provider may recommend screening at an earlier age if you have risk factors for colon cancer.  Your health care provider may recommend using home test kits to check for hidden blood in the stool.  A small camera at the end of  a tube can be used to examine your colon (sigmoidoscopy or colonoscopy). This checks for the earliest forms of colorectal cancer.  Prostate and Testicular Cancer  Depending on your age and overall health, your health care provider may do certain tests to screen for prostate and testicular cancer.  Talk to your health care provider about any symptoms or concerns you have about testicular or prostate cancer.  Skin Cancer  Check your skin from head to toe regularly.  Tell your health care provider about any new moles or changes in moles, especially if: ? There is a change in a mole's  size, shape, or color. ? You have a mole that is larger than a pencil eraser.  Always use sunscreen. Apply sunscreen liberally and repeat throughout the day.  Protect yourself by wearing long sleeves, pants, a wide-brimmed hat, and sunglasses when outside.  What should I know about heart disease, diabetes, and high blood pressure?  If you are 65-38 years of age, have your blood pressure checked every 3-5 years. If you are 4 years of age or older, have your blood pressure checked every year. You should have your blood pressure measured twice-once when you are at a hospital or clinic, and once when you are not at a hospital or clinic. Record the average of the two measurements. To check your blood pressure when you are not at a hospital or clinic, you can use: ? An automated blood pressure machine at a pharmacy. ? A home blood pressure monitor.  Talk to your health care provider about your target blood pressure.  If you are between 69-50 years old, ask your health care provider if you should take aspirin to prevent heart disease.  Have regular diabetes screenings by checking your fasting blood sugar level. ? If you are at a normal weight and have a low risk for diabetes, have this test once every three years after the age of 59. ? If you are overweight and have a high risk for diabetes, consider being tested at a younger age or more often.  A one-time screening for abdominal aortic aneurysm (AAA) by ultrasound is recommended for men aged 36-75 years who are current or former smokers. What should I know about preventing infection? Hepatitis B If you have a higher risk for hepatitis B, you should be screened for this virus. Talk with your health care provider to find out if you are at risk for hepatitis B infection. Hepatitis C Blood testing is recommended for:  Everyone born from [birth year] through 1963.  Anyone with known risk factors for hepatitis C.  Sexually Transmitted Diseases  (STDs)  You should be screened each year for STDs including gonorrhea and chlamydia if: ? You are sexually active and are younger than 70 years of age. ? You are older than 70 years of age and your health care provider tells you that you are at risk for this type of infection. ? Your sexual activity has changed since you were last screened and you are at an increased risk for chlamydia or gonorrhea. Ask your health care provider if you are at risk.  Talk with your health care provider about whether you are at high risk of being infected with HIV. Your health care provider may recommend a prescription medicine to help prevent HIV infection.  What else can I do?  Schedule regular health, dental, and eye exams.  Stay current with your vaccines (immunizations).  Do not use any tobacco products, such as cigarettes, chewing  tobacco, and e-cigarettes. If you need help quitting, ask your health care provider.  Limit alcohol intake to no more than 2 drinks per day. One drink equals 12 ounces of beer, 5 ounces of wine, or 1 ounces of hard liquor.  Do not use street drugs.  Do not share needles.  Ask your health care provider for help if you need support or information about quitting drugs.  Tell your health care provider if you often feel depressed.  Tell your health care provider if you have ever been abused or do not feel safe at home. This information is not intended to replace advice given to you by your health care provider. Make sure you discuss any questions you have with your health care provider. Document Released: 09/14/2007 Document Revised: 11/15/2015 Document Reviewed: 12/20/2014 Elsevier Interactive Patient Education  [employer].

## 2017-06-04 ENCOUNTER — Encounter: Payer: Self-pay | Admitting: Family Medicine

## 2017-06-04 ENCOUNTER — Ambulatory Visit (INDEPENDENT_AMBULATORY_CARE_PROVIDER_SITE_OTHER): Payer: Medicare Other | Admitting: Family Medicine

## 2017-06-04 VITALS — BP 132/78 | HR 69 | Temp 98.6°F | Ht 71.0 in | Wt 199.2 lb

## 2017-06-04 DIAGNOSIS — N529 Male erectile dysfunction, unspecified: Secondary | ICD-10-CM | POA: Diagnosis not present

## 2017-06-04 DIAGNOSIS — I5042 Chronic combined systolic (congestive) and diastolic (congestive) heart failure: Secondary | ICD-10-CM

## 2017-06-04 DIAGNOSIS — I10 Essential (primary) hypertension: Secondary | ICD-10-CM | POA: Diagnosis not present

## 2017-06-04 DIAGNOSIS — E785 Hyperlipidemia, unspecified: Secondary | ICD-10-CM

## 2017-06-04 MED ORDER — SILDENAFIL CITRATE 50 MG PO TABS: 50.0000 mg | ORAL_TABLET | Freq: Every day | ORAL | 0 refills | Status: AC | PRN

## 2017-06-04 NOTE — Progress Notes (Signed)
Chief Complaint  Patient presents with  . Follow-up    Subjective Shane Gomez is a 72 y.o. male who presents for hypertension follow up. He does monitor home blood pressures. Blood pressures ranging from 130's/70's on average. He is compliant with medication- lisinopril 20 mg/d. Patient has these side effects of medication: none He is adhering to a healthy diet overall. Current exercise: Physical active at work  Hyperlipidemia Patient presents for dyslipidemia follow up. Currently being treated with lovastatin 40 mg/d and compliance with treatment thus far has been good. He denies myalgias. He is adhering to a healthy. The patient exercises never.  The patient is not known to have coexisting coronary artery disease.  CHF Hx of diastolic and systolic heart failure. Currently on Lasix, ACEi, and Coreg. He is still able to go up 3 flights of stairs before becoming short of breath. No unexpected weight changes. No LE edema, CP or cough.   Past Medical History:  Diagnosis Date  . CHF (congestive heart failure) ([place])   . Hyperlipidemia 02/17/2016  . Hypertension    Family History  Problem Relation Age of Onset  . Cancer Mother   . Cancer Father   . Heart failure Neg Hx     Medications Current Outpatient Medications on File Prior to Visit  Medication Sig Dispense Refill  . carvedilol (COREG) 6.25 MG tablet TAKE 1 TABLET(6.25 MG) BY MOUTH TWICE DAILY 180 tablet 0  . furosemide (LASIX) 20 MG tablet TAKE 1 TABLET(20 MG) BY MOUTH DAILY AS NEEDED FOR FLUID RETENTION OR SWELLING 90 tablet 0  . lisinopril (PRINIVIL,ZESTRIL) 20 MG tablet TAKE 1 TABLET BY MOUTH DAILY 30 tablet 0  . lovastatin (MEVACOR) 40 MG tablet TAKE 1 TABLET(40 MG) BY MOUTH AT BEDTIME 90 tablet 0  . Misc Natural Products (OSTEO BI-FLEX JOINT SHIELD PO) Take 2 tablets by mouth daily.    . Multiple Vitamin (MULTIVITAMIN WITH MINERALS) TABS tablet Take 1 tablet by mouth daily.    [name] 3-6-9 Fatty Acids  (OMEGA-3-6-9 PO) Take 1 capsule by mouth daily.     Allergies No Known Allergies  Review of Systems Cardiovascular: no chest pain Respiratory:  no shortness of breath  Exam BP 132/78 (BP Location: Left Arm, Patient Position: Sitting, Cuff Size: Large)   Pulse 69   Temp 98.6 F (37 C) (Oral)   Ht 5\' 11"  (1.803 m)   Wt 199 lb 4 oz (90.4 kg)   SpO2 94%   BMI 27.79 kg/m  General:  well developed, well nourished, in no apparent distress Skin: warm, no pallor or diaphoresis Eyes: pupils equal and round, sclera anicteric without injection Heart: RRR, no bruits, no LE edema Lungs: clear to auscultation, no accessory muscle use Psych: well oriented with normal range of affect and appropriate judgment/insight  Essential hypertension - Plan: Comprehensive metabolic panel  Hyperlipidemia, unspecified hyperlipidemia type - Plan: Lipid panel  Chronic combined systolic and diastolic heart failure (HCC) - Plan: Comprehensive metabolic panel  Erectile dysfunction, unspecified erectile dysfunction type  Orders as above. Counseled on diet and exercise NYHA I, no indication for Entresto at this stage.  Cont Mevacor.  Try Viagra, likely won't be covered. Instructed to check out online offers.  F/u in 6 mo for med check. The patient voiced understanding and agreement to the plan.  [place], DO 06/04/17  8:11 AM

## 2017-06-04 NOTE — Patient Instructions (Addendum)
Stay physically active.  Keep the diet clean.  Let us know if you want to try to come down on the Lasix (water pill).  Do not fill new prescription if it is too expensive. Look for coupons online and let me know if you find any.  Let us know if you need anything.

## 2017-06-04 NOTE — Progress Notes (Signed)
Pre visit review using our clinic review tool, if applicable. No additional management support is needed unless otherwise documented below in the visit note. 

## 2017-06-25 ENCOUNTER — Other Ambulatory Visit: Payer: Self-pay | Admitting: Family Medicine

## 2017-06-25 DIAGNOSIS — I10 Essential (primary) hypertension: Secondary | ICD-10-CM

## 2017-07-25 ENCOUNTER — Other Ambulatory Visit: Payer: Self-pay | Admitting: Family Medicine

## 2017-07-25 DIAGNOSIS — I10 Essential (primary) hypertension: Secondary | ICD-10-CM

## 2017-07-25 DIAGNOSIS — I5042 Chronic combined systolic (congestive) and diastolic (congestive) heart failure: Secondary | ICD-10-CM

## 2017-08-22 ENCOUNTER — Other Ambulatory Visit: Payer: Self-pay | Admitting: Family Medicine

## 2017-08-22 DIAGNOSIS — I10 Essential (primary) hypertension: Secondary | ICD-10-CM

## 2017-09-22 ENCOUNTER — Other Ambulatory Visit: Payer: Self-pay | Admitting: Family Medicine

## 2017-09-22 DIAGNOSIS — I5042 Chronic combined systolic (congestive) and diastolic (congestive) heart failure: Secondary | ICD-10-CM

## 2017-12-24 ENCOUNTER — Other Ambulatory Visit: Payer: Self-pay | Admitting: Family Medicine

## 2017-12-24 DIAGNOSIS — I5042 Chronic combined systolic (congestive) and diastolic (congestive) heart failure: Secondary | ICD-10-CM

## 2017-12-24 DIAGNOSIS — I10 Essential (primary) hypertension: Secondary | ICD-10-CM

## 2018-01-08 DIAGNOSIS — Z23 Encounter for immunization: Secondary | ICD-10-CM | POA: Diagnosis not present

## 2018-04-22 ENCOUNTER — Other Ambulatory Visit: Payer: Self-pay | Admitting: Family Medicine

## 2018-04-22 DIAGNOSIS — I10 Essential (primary) hypertension: Secondary | ICD-10-CM

## 2018-04-22 DIAGNOSIS — I5042 Chronic combined systolic (congestive) and diastolic (congestive) heart failure: Secondary | ICD-10-CM

## 2018-07-17 ENCOUNTER — Other Ambulatory Visit: Payer: Self-pay | Admitting: Family Medicine

## 2018-07-17 DIAGNOSIS — I5042 Chronic combined systolic (congestive) and diastolic (congestive) heart failure: Secondary | ICD-10-CM

## 2018-07-20 ENCOUNTER — Encounter: Payer: Self-pay | Admitting: Family Medicine

## 2018-07-20 ENCOUNTER — Other Ambulatory Visit: Payer: Self-pay

## 2018-07-20 ENCOUNTER — Ambulatory Visit (INDEPENDENT_AMBULATORY_CARE_PROVIDER_SITE_OTHER): Payer: Medicare Other | Admitting: Family Medicine

## 2018-07-20 ENCOUNTER — Telehealth: Payer: Self-pay

## 2018-07-20 DIAGNOSIS — I5042 Chronic combined systolic (congestive) and diastolic (congestive) heart failure: Secondary | ICD-10-CM

## 2018-07-20 DIAGNOSIS — E785 Hyperlipidemia, unspecified: Secondary | ICD-10-CM | POA: Diagnosis not present

## 2018-07-20 DIAGNOSIS — I10 Essential (primary) hypertension: Secondary | ICD-10-CM | POA: Diagnosis not present

## 2018-07-20 NOTE — Progress Notes (Signed)
CC: HTN  Subjective Shane Gomez is a 73 y.o. male who presents for hypertension follow up. Due to outbreak, we are interacting via web portal for an electronic face-to-face visit. [name]  He does monitor home blood pressures. Blood pressures ranging from 150's/70's on average. He is compliant with medications- lisinopril 20 mg/d, Coreg 6.25 mg bid. Patient has these side effects of medication: none He is adhering to a healthy diet overall. Current exercise: walking  Hyperlipidemia Patient presents for dyslipidemia follow up. Currently being treated with Mevacor 40 mg/d and compliance with treatment thus far has been good. He denies myalgias. He is adhering to a healthy. Exercise: walking The patient is not known to have coexisting coronary artery disease.   Hx of CHF Currently on Lasix w/o K, ACEi and betablocker as noted above. No swelling or sob.   Past Medical History:  Diagnosis Date  . CHF (congestive heart failure) ([place])   . Hyperlipidemia 02/17/2016  . Hypertension     Review of Systems Cardiovascular: no chest pain Respiratory:  no shortness of breath  Exam No conversational dyspnea Age appropriate judgment and insight Nml affect and mood  Essential hypertension - Plan: Comprehensive metabolic panel  Hyperlipidemia, unspecified hyperlipidemia type - Plan: Comprehensive metabolic panel, Lipid panel  Chronic combined systolic and diastolic heart failure (HCC)  Ck labs. Goal BP is <150/90. Opted for lifestyle mods. Will check BP's at home, if not improving with lifestyle changes in 4 weeks, will follow up. Otherwise I will see him in 6 mo for med ck and we will sched AWV. Cont Mevacor. Cont Lasix, may add K depending on CMP, Coreg. The patient voiced understanding and agreement to the plan.  [place], DO 07/20/18  1:12 PM

## 2018-07-22 ENCOUNTER — Other Ambulatory Visit: Payer: Self-pay | Admitting: Family Medicine

## 2018-07-22 DIAGNOSIS — I5042 Chronic combined systolic (congestive) and diastolic (congestive) heart failure: Secondary | ICD-10-CM

## 2018-08-11 ENCOUNTER — Encounter: Payer: Self-pay | Admitting: Family Medicine

## 2018-08-11 ENCOUNTER — Ambulatory Visit (INDEPENDENT_AMBULATORY_CARE_PROVIDER_SITE_OTHER): Payer: Medicare Other | Admitting: Family Medicine

## 2018-08-11 ENCOUNTER — Other Ambulatory Visit: Payer: Self-pay

## 2018-08-11 ENCOUNTER — Telehealth: Payer: Self-pay | Admitting: Family Medicine

## 2018-08-11 ENCOUNTER — Other Ambulatory Visit: Payer: Self-pay | Admitting: Family Medicine

## 2018-08-11 DIAGNOSIS — I10 Essential (primary) hypertension: Secondary | ICD-10-CM

## 2018-08-11 DIAGNOSIS — M25561 Pain in right knee: Secondary | ICD-10-CM

## 2018-08-11 MED ORDER — MELOXICAM 15 MG PO TABS: 15.0000 mg | ORAL_TABLET | Freq: Every day | ORAL | 0 refills | Status: DC

## 2018-08-11 MED ORDER — MELOXICAM 15 MG PO TABS: 15.0000 mg | ORAL_TABLET | Freq: Every day | ORAL | 0 refills | Status: AC

## 2018-08-11 NOTE — Telephone Encounter (Signed)
Copied from [place] [id number]. Topic: Quick Communication - See Telephone Encounter >> Aug 11, 2018  8:25 AM Robina Ade, Helene Kelp D wrote: CRM for notification. See Telephone encounter for: 08/11/18. Patient called and said that his right knee is swelling and he would like something for pain and to bring the swelling down and it can be send to his pharmacy. Please call patient back, thanks.

## 2018-08-11 NOTE — Progress Notes (Addendum)
Musculoskeletal Exam  Patient: Shane Gomez DOB: 03/06/1946  DOS: 08/11/2018  SUBJECTIVE:  Chief Complaint:   CC: R knee pain  Demonta Gomez is a 73 y.o.  male for evaluation and treatment of R knee pain. [name], we are interacting via telephonet. [name] Patient agreed to proceed with visit via this method. Patient is at home, I am at office. Patient and I are present for visit.    Onset:  7 days ago. No inj or change in activity.  Location: anterior knee Character:  sharp  Progression of issue:  is unchanged Associated symptoms: swelling, decreased ROM Hurts when he stands for periods of tim Treatment: to date has been ice.   Neurovascular symptoms: no No hx of gout, no dietary changes. Denies fevers, redness, bruising.   BP is also elevated first thing in AM, running in 150's sbp. A few hrs later, it will be in 120-130's. Takes lisinopril at night, compliant. Also compliant with Coreg 6.25 mg bid. No changes in diet.   ROS: Musculoskeletal/Extremities: +R knee pain  Past Medical History:  Diagnosis Date  . CHF (congestive heart failure) ([place])   . Hyperlipidemia 02/17/2016  . Hypertension     Objective: No conversational dyspnea Age appropriate judgment and insight Nml affect and mood  Assessment:  Acute pain of right knee - Plan: meloxicam (MOBIC) 15 MG tablet  Essential hypertension  Plan: 1- short course of meloxicam. Tylenol. Cont ice. 2- Cont Coreg. Take lisinopril in AM to see what his BP does.  Total of 11 min spent on the phone.  F/u 8 d in person to reval above. The patient voiced understanding and agreement to the plan.   [place], DO 08/11/18  9:40 AM

## 2018-08-11 NOTE — Telephone Encounter (Signed)
Copied from [place] [id number]. Topic: Quick Communication - Rx Refill/Question >> Aug 11, 2018  1:34 PM Rainey Pines A wrote: Medication: meloxicam (MOBIC) 15 MG tablet (Patient stated that medication was sent to the wrong pharmacy.)  Has the patient contacted their pharmacy? Yes (Agent: If no, request that the patient contact the pharmacy for the refill.) (Agent: If yes, when and what did the pharmacy advise?)Contact PCP  Preferred Pharmacy (with phone number or street name): [facility] [address] [phone] (Phone) [phone] (Fax)    Agent: Please be advised that RX refills may take up to 3 business days. We ask that you follow-up with your pharmacy.

## 2018-08-11 NOTE — Telephone Encounter (Signed)
Schedule appt today at 9:30

## 2018-08-19 ENCOUNTER — Other Ambulatory Visit: Payer: Self-pay | Admitting: Family Medicine

## 2018-08-19 DIAGNOSIS — I10 Essential (primary) hypertension: Secondary | ICD-10-CM

## 2018-09-08 ENCOUNTER — Other Ambulatory Visit: Payer: Self-pay

## 2018-09-08 ENCOUNTER — Ambulatory Visit (INDEPENDENT_AMBULATORY_CARE_PROVIDER_SITE_OTHER): Payer: Medicare Other | Admitting: Family Medicine

## 2018-09-08 ENCOUNTER — Telehealth: Payer: Self-pay | Admitting: Family Medicine

## 2018-09-08 ENCOUNTER — Encounter: Payer: Self-pay | Admitting: Family Medicine

## 2018-09-08 DIAGNOSIS — M25561 Pain in right knee: Secondary | ICD-10-CM

## 2018-09-08 NOTE — Telephone Encounter (Signed)
Copied from [place] [id number]. Topic: Quick Communication - See Telephone Encounter >> Sep 08, 2018  8:04 AM Rayann Heman wrote: CRM for notification. See Telephone encounter for: 09/08/18. Pt called and stated that he was seen on 08/11/18 right knee pain. Pt states that the swelling has went down but the pain is pretty bad. Pt states that when he bends the knee or walks it hurts very bad. Pt states that the meloxicam has not made the knee better. Pt states that he would like something for pain. Pt would like a call back regarding. Please advise

## 2018-09-08 NOTE — Telephone Encounter (Signed)
Scheduled Telephone visit today at 11///has only a flip phone

## 2018-09-08 NOTE — Progress Notes (Signed)
Chief Complaint  Patient presents with  . Knee Pain    right knee is no better, taking ibuprofen but not helping    Subjective: Patient is a 73 y.o. male here for f/u knee pain. [name], we are interacting via telephone. [name] Patient agreed to proceed with visit via this method. Patient is at home, I am at home. Patient and I are present for visit.   Pt seen on 5/12 for R knee pain/swelling. The swelling is better. Pain persists. Ibuprofen not helpful. NO bruising, neurologic s/s's, recent injr/change in activity. He was forced into retirement due to pain. It does not catch/lock. He has never had plain films.   ROS: MSK: As noted in HPI  Past Medical History:  Diagnosis Date  . CHF (congestive heart failure) ([place])   . Hyperlipidemia 02/17/2016  . Hypertension     Objective: No conversational dyspnea Age appropriate judgment and insight Nml affect and mood  Assessment and Plan: Right knee pain, unspecified chronicity  I want to see him in office tomorrow. Will ck XR's and consider injection. If no improvement, will refer pending results of films.  Total time spent: 6 min The patient voiced understanding and agreement to the plan.  [place], DO 09/08/18  11:05 AM

## 2018-09-09 ENCOUNTER — Ambulatory Visit (INDEPENDENT_AMBULATORY_CARE_PROVIDER_SITE_OTHER): Payer: Medicare Other | Admitting: Family Medicine

## 2018-09-09 ENCOUNTER — Other Ambulatory Visit: Payer: Self-pay

## 2018-09-09 ENCOUNTER — Encounter: Payer: Self-pay | Admitting: Family Medicine

## 2018-09-09 VITALS — BP 124/84 | HR 68 | Temp 98.4°F | Ht 71.0 in | Wt 200.0 lb

## 2018-09-09 DIAGNOSIS — M6751 Plica syndrome, right knee: Secondary | ICD-10-CM

## 2018-09-09 MED ORDER — METHYLPREDNISOLONE ACETATE 40 MG/ML IJ SUSP
20.0000 mg | Freq: Once | INTRAMUSCULAR | Status: AC
  Administered 2018-09-09: 20 mg via INTRA_ARTICULAR

## 2018-09-09 MED ORDER — PREDNISONE 20 MG PO TABS: 40.0000 mg | ORAL_TABLET | Freq: Every day | ORAL | 0 refills | Status: AC

## 2018-09-09 NOTE — Progress Notes (Signed)
Chief Complaint  Patient presents with  . Knee Problem    right    Subjective: Patient is a 73 y.o. male here for knee pain f/u. Seen yesterday virtually, could not est solid dx, suspected he would need films and IA injection today. No change from yesterday.    Past Medical History:  Diagnosis Date  . CHF (congestive heart failure) ([place])   . Hyperlipidemia 02/17/2016  . Hypertension     Objective: BP 124/84 (BP Location: Left Arm, Patient Position: Sitting, Cuff Size: Normal)   Pulse 68   Temp 98.4 F (36.9 C) (Oral)   Ht 5\' 11"  (1.803 m)   Wt 200 lb (90.7 kg)   SpO2 94%   BMI 27.89 kg/m  General: Awake, appears stated age MSK: No gross edema/deformity. There is ttp over plica on R medial fem condyle, no other ttp noted, no effusion, neg patellar app/grind, varus/valgus, Stine's, Lachman's Lungs: No accessory muscle use Psych: Age appropriate judgment and insight, normal affect and mood  Procedure Note; Knee plica injection Verbal consent obtained. The area of interest was palpated and cleaned with alcohol x1. A 27-gauge needle was used to superficially introduce 20 mg of Depomedrol with 0.5 mL of 1% lidocaine was injected. A bandaid was placed. The patient tolerated the procedure well. There were no complications noted.  Assessment and Plan: Synovial plica syndrome of right knee - Plan: predniSONE (DELTASONE) 20 MG tablet, methylPREDNISolone acetate (DEPO-MEDROL) injection 20 mg, PR INJECT TENDON SHEATH/LIGAMENT  Orders as above. 5 d pred burst while injected steroid kicks in. Ice. If no improvement in next 1-2 weeks, will refer to sports med. The patient voiced understanding and agreement to the plan.  [place], DO 09/09/18  11:57 AM

## 2018-09-09 NOTE — Patient Instructions (Addendum)
Ice/cold pack over area for 10-15 min twice daily.  OK to take Tylenol 1000 mg (2 extra strength tabs) or 975 mg (3 regular strength tabs) every 6 hours as needed.  Let me know a week from Monday if you are not improving.  Let us know if you need anything.

## 2018-09-15 ENCOUNTER — Other Ambulatory Visit: Payer: Self-pay | Admitting: Family Medicine

## 2018-09-15 MED ORDER — CARVEDILOL 6.25 MG PO TABS: ORAL_TABLET | ORAL | 0 refills | Status: DC

## 2018-09-17 ENCOUNTER — Telehealth: Payer: Self-pay | Admitting: Family Medicine

## 2018-09-17 DIAGNOSIS — M25561 Pain in right knee: Secondary | ICD-10-CM

## 2018-09-17 MED ORDER — TRAMADOL HCL 50 MG PO TABS: 50.0000 mg | ORAL_TABLET | Freq: Three times a day (TID) | ORAL | 0 refills | Status: DC | PRN

## 2018-09-17 NOTE — Telephone Encounter (Signed)
Tramadol sent in for as needed use. Cont ice and Tylenol. I placed referral for Sports medicine. Ty.

## 2018-09-17 NOTE — Addendum Note (Signed)
Addended by: Ames Coupe on: 09/17/2018 02:42 PM   Modules accepted: Orders

## 2018-09-17 NOTE — Telephone Encounter (Signed)
Copied from [place] [id number]. Topic: General - Other >> Sep 17, 2018  8:35 AM Carolyn Stare wrote: Pt call to let Dr Nani Ravens know that he still having right knee pain, said he cant walk on it and is needing something for pain   Last OV 09/09/2018---was seen for this problem

## 2018-09-17 NOTE — Telephone Encounter (Signed)
Patient informed of information/had no other questions.

## 2018-09-18 MED ORDER — TRAMADOL HCL 50 MG PO TABS: 50.0000 mg | ORAL_TABLET | Freq: Three times a day (TID) | ORAL | 0 refills | Status: AC | PRN

## 2018-09-18 NOTE — Telephone Encounter (Signed)
Patient would like traMADol (ULTRAM) 50 MG tablet sent to   [facility] [place], [place] [phone] (Phone) [phone] (Fax)

## 2018-09-18 NOTE — Addendum Note (Signed)
Addended by: Ames Coupe on: 09/18/2018 01:28 PM   Modules accepted: Orders

## 2018-09-24 ENCOUNTER — Other Ambulatory Visit: Payer: Self-pay

## 2018-09-24 ENCOUNTER — Ambulatory Visit (INDEPENDENT_AMBULATORY_CARE_PROVIDER_SITE_OTHER): Payer: Medicare Other | Admitting: Family Medicine

## 2018-09-24 ENCOUNTER — Encounter: Payer: Self-pay | Admitting: Family Medicine

## 2018-09-24 ENCOUNTER — Ambulatory Visit: Payer: Self-pay

## 2018-09-24 VITALS — BP 137/85 | HR 66 | Ht 71.0 in | Wt 200.0 lb

## 2018-09-24 DIAGNOSIS — M179 Osteoarthritis of knee, unspecified: Secondary | ICD-10-CM | POA: Insufficient documentation

## 2018-09-24 DIAGNOSIS — M1711 Unilateral primary osteoarthritis, right knee: Secondary | ICD-10-CM

## 2018-09-24 DIAGNOSIS — M25561 Pain in right knee: Secondary | ICD-10-CM

## 2018-09-24 DIAGNOSIS — M1710 Unilateral primary osteoarthritis, unspecified knee: Secondary | ICD-10-CM | POA: Insufficient documentation

## 2018-09-24 MED ORDER — TRIAMCINOLONE ACETONIDE 40 MG/ML IJ SUSP
40.0000 mg | Freq: Once | INTRAMUSCULAR | Status: AC
  Administered 2018-09-24: 40 mg via INTRA_ARTICULAR

## 2018-09-24 NOTE — Progress Notes (Signed)
Medication Samples have been provided to the patient.  Drug name: Pennsaid       Strength: 2%       Qty: 2 Boxes  LOT: [id number]  Exp.Date: 05/2019  Dosing instructions: Use a peasize amount and rub gently.  The patient has been instructed regarding the correct time, dose, and frequency of taking this medication, including desired effects and most common side effects.   Sherrie George, [place] 12:16 PM 09/24/2018

## 2018-09-24 NOTE — Assessment & Plan Note (Signed)
He does have degenerative changes of the medial joint line but has a significant inflammatory response.  Possibly related to gout versus an insufficiency fracture.  His quadricep on the right appears to be smaller than the left. -Intra-articular injection today. -Uric acid -X-ray -Counseled on home exercise therapy and supportive care. -May need to consider gel injections

## 2018-09-24 NOTE — Patient Instructions (Signed)
Nice to meet you Please try the rub on medicine. You can buy voltaren 1% at the pharmacy.  Take tylenol 650 mg three times a day is the best evidence based medicine we have for arthritis.  I will call you with the results from today. Please send me a message in MyChart with any questions or updates.  Please see me back in 4 weeks.   --Dr. Raeford Razor

## 2018-09-24 NOTE — Progress Notes (Signed)
Shane Gomez - 73 y.o. male MRN [id number]  Date of birth: 05/10/1945  SUBJECTIVE:  Including CC & ROS.  Chief Complaint  Patient presents with  . Knee Pain    right knee x 2 months    Shane Gomez is a 73 y.o. male that is presenting with right knee pain.  The pain is been ongoing for 2 months.  Denies any inciting event or injury.  Having significant medial sided joint pain.  Has a history of a knee surgery from 20 years ago.  Is having mechanical symptoms with lack of flexion and extension.  Pain is constant and severe.  Has tried different medications with limited improvement.  Denies a history of gout.  Pain is localized to the knee.  Had had some swelling previously. He was provided a plica injection on 8/29. Reports a 20-30% improvement.    Review of Systems  Constitutional: Negative for fever.  HENT: Negative for congestion.   Respiratory: Negative for cough.   Cardiovascular: Negative for chest pain.  Gastrointestinal: Negative for abdominal pain.  Musculoskeletal: Positive for arthralgias, gait problem and joint swelling.  Skin: Negative for color change.  Neurological: Negative for weakness.  Hematological: Negative for adenopathy.    HISTORY: Past Medical, Surgical, Social, and Family History Reviewed & Updated per EMR.   Pertinent Historical Findings include:  Past Medical History:  Diagnosis Date  . CHF (congestive heart failure) ([place])   . Hyperlipidemia 02/17/2016  . Hypertension     Past Surgical History:  Procedure Laterality Date  . COLONOSCOPY WITH PROPOFOL N/A 08/13/2016   Procedure: COLONOSCOPY WITH PROPOFOL;  Surgeon: Manus Gunning, MD;  Location: [facility];  Service: Gastroenterology;  Laterality: N/A;  . EYE SURGERY      No Known Allergies  Family History  Problem Relation Age of Onset  . Cancer Mother   . Cancer Father   . Heart failure Neg Hx      Social History   Socioeconomic History  . Marital status: Single    Spouse  name: Not on file  . Number of children: Not on file  . Years of education: Not on file  . Highest education level: Not on file  Occupational History  . Not on file  Social Needs  . Financial resource strain: Not on file  . Food insecurity    Worry: Not on file    Inability: Not on file  . Transportation needs    Medical: Not on file    Non-medical: Not on file  Tobacco Use  . Smoking status: Former Research scientist (life sciences)  . Smokeless tobacco: Never Used  Substance and Sexual Activity  . Alcohol use: Yes    Comment: 2 beers per day  . Drug use: No  . Sexual activity: Not on file  Lifestyle  . Physical activity    Days per week: Not on file    Minutes per session: Not on file  . Stress: Not on file  Relationships  . Social Herbalist on phone: Not on file    Gets together: Not on file    Attends religious service: Not on file    Active member of club or organization: Not on file    Attends meetings of clubs or organizations: Not on file    Relationship status: Not on file  . Intimate partner violence    Fear of current or ex partner: Not on file    Emotionally abused: Not on file  Physically abused: Not on file    Forced sexual activity: Not on file  Other Topics Concern  . Not on file  Social History Narrative  . Not on file     PHYSICAL EXAM:  VS: BP 137/85   Pulse 66   Ht 5\' 11"  (1.803 m)   Wt 200 lb (90.7 kg)   BMI 27.89 kg/m  Physical Exam Gen: NAD, alert, cooperative with exam, well-appearing ENT: normal lips, normal nasal mucosa,  Eye: normal EOM, normal conjunctiva and lids CV:  no edema, +2 pedal pulses   Resp: no accessory muscle use, non-labored,  Skin: no rashes, no areas of induration  Neuro: normal tone, normal sensation to touch Psych:  normal insight, alert and oriented MSK:  Right knee: Tenderness to palpation of the medial joint line. Limited flexion throughout 105 degrees.  Limited extension to around 5 degrees. No effusion. Atrophy of  the vastus medialis compared to the contralateral side. Negative McMurray's test. Some instability valgus varus stress testing. No pain with patellar grind. Neurovascularly intact  Limited ultrasound: Right knee:  Mild to moderate effusion. Normal appearing quadricep and patellar tendon. Moderate degenerative changes of the medial joint line.  There is an increased hyperemia to suggest an inflammatory response and underlying insufficiency fracture of the proximal medial tibia.  Summary: Moderate degenerative changes of the medial joint line with degenerative meniscal changes.  Inflammatory response to suggest either inflammatory process or underlying insufficiency fracture.  Ultrasound and interpretation by Clearance Coots, MD   Aspiration/Injection Procedure Note Shane Gomez May 19, 1945  Procedure: Injection Indications: Right knee pain  Procedure Details Consent: Risks of procedure as well as the alternatives and risks of each were explained to the (patient/caregiver).  Consent for procedure obtained. Time Out: Verified patient identification, verified procedure, site/side was marked, verified correct patient position, special equipment/implants available, medications/allergies/relevent history reviewed, required imaging and test results available.  Performed.  The area was cleaned with iodine and alcohol swabs.    The right knee superior lateral suprapatellar pouch was injected using 1 cc's of 40 mg Kenalog and 4 cc's of 0.25% bupivacaine with a 22 1 1/2" needle.  Ultrasound was used. Images were obtained in long views showing the injection.     A sterile dressing was applied.  Patient did tolerate procedure well.      ASSESSMENT & PLAN:   OA (osteoarthritis) of knee He does have degenerative changes of the medial joint line but has a significant inflammatory response.  Possibly related to gout versus an insufficiency fracture.  His quadricep on the right appears to be  smaller than the left. -Intra-articular injection today. -Uric acid -X-ray -Counseled on home exercise therapy and supportive care. -May need to consider gel injections

## 2018-09-25 LAB — URIC ACID: Uric Acid: 7.1 mg/dL (ref 3.7–8.6)

## 2018-09-29 ENCOUNTER — Telehealth: Payer: Self-pay | Admitting: Family Medicine

## 2018-09-29 NOTE — Telephone Encounter (Signed)
Spoke with patient about results.  Knee pain possible to be related to gout. If flares again may need to go on a controller medication.   Rosemarie Ax, MD [facility] 09/29/2018, 9:04 AM

## 2018-10-05 ENCOUNTER — Telehealth: Payer: Self-pay | Admitting: Family Medicine

## 2018-10-05 NOTE — Telephone Encounter (Signed)
Patient called stating he is still having pain in his right knee. He is asking for medication to help with pain.   Preferred pharmacy: [facility] on [address]

## 2018-10-06 MED ORDER — PREDNISONE 5 MG PO TABS: ORAL_TABLET | ORAL | 0 refills | Status: DC

## 2018-10-06 MED FILL — predniSONE 5 MG TABS: 5 | 6 days supply | Qty: 21 | Fill #0

## 2018-10-06 NOTE — Telephone Encounter (Signed)
Spoke with patient. Still having pain. Will try prednisone.   Rosemarie Ax, MD [facility] 10/06/2018, 10:14 AM

## 2018-10-07 ENCOUNTER — Other Ambulatory Visit: Payer: Self-pay | Admitting: Family Medicine

## 2018-10-07 MED ORDER — PREDNISONE 5 MG PO TABS: ORAL_TABLET | ORAL | 0 refills | Status: AC

## 2018-10-13 ENCOUNTER — Other Ambulatory Visit: Payer: Self-pay | Admitting: Family Medicine

## 2018-10-13 DIAGNOSIS — I5042 Chronic combined systolic (congestive) and diastolic (congestive) heart failure: Secondary | ICD-10-CM

## 2018-10-22 ENCOUNTER — Ambulatory Visit: Payer: Medicare Other | Admitting: Family Medicine

## 2018-12-15 ENCOUNTER — Other Ambulatory Visit: Payer: Self-pay | Admitting: Family Medicine

## 2018-12-15 DIAGNOSIS — Z23 Encounter for immunization: Secondary | ICD-10-CM | POA: Diagnosis not present

## 2018-12-15 MED ORDER — CARVEDILOL 6.25 MG PO TABS: ORAL_TABLET | ORAL | 0 refills | Status: DC

## 2019-01-11 ENCOUNTER — Other Ambulatory Visit: Payer: Self-pay | Admitting: Family Medicine

## 2019-01-11 DIAGNOSIS — I5042 Chronic combined systolic (congestive) and diastolic (congestive) heart failure: Secondary | ICD-10-CM

## 2019-01-11 MED ORDER — FUROSEMIDE 20 MG PO TABS: ORAL_TABLET | ORAL | 1 refills | Status: DC

## 2019-01-15 ENCOUNTER — Telehealth: Payer: Self-pay | Admitting: Family Medicine

## 2019-01-15 NOTE — Telephone Encounter (Signed)
Called patient to schedule AWV, but no answer. Will try to call patient back at a later time. SF 

## 2019-02-05 DIAGNOSIS — Z6826 Body mass index (BMI) 26.0-26.9, adult: Secondary | ICD-10-CM | POA: Diagnosis not present

## 2019-02-05 DIAGNOSIS — I10 Essential (primary) hypertension: Secondary | ICD-10-CM | POA: Diagnosis not present

## 2019-02-05 DIAGNOSIS — M109 Gout, unspecified: Secondary | ICD-10-CM | POA: Diagnosis not present

## 2019-02-05 DIAGNOSIS — E782 Mixed hyperlipidemia: Secondary | ICD-10-CM | POA: Diagnosis not present

## 2019-02-05 DIAGNOSIS — Z79899 Other long term (current) drug therapy: Secondary | ICD-10-CM | POA: Diagnosis not present

## 2019-02-05 DIAGNOSIS — Z1331 Encounter for screening for depression: Secondary | ICD-10-CM | POA: Diagnosis not present

## 2019-02-06 DIAGNOSIS — E782 Mixed hyperlipidemia: Secondary | ICD-10-CM | POA: Diagnosis not present

## 2019-02-06 DIAGNOSIS — I10 Essential (primary) hypertension: Secondary | ICD-10-CM | POA: Diagnosis not present

## 2019-02-06 DIAGNOSIS — M109 Gout, unspecified: Secondary | ICD-10-CM | POA: Diagnosis not present

## 2019-02-06 DIAGNOSIS — Z79899 Other long term (current) drug therapy: Secondary | ICD-10-CM | POA: Diagnosis not present

## 2019-02-08 ENCOUNTER — Other Ambulatory Visit: Payer: Self-pay | Admitting: Family Medicine

## 2019-02-08 DIAGNOSIS — I10 Essential (primary) hypertension: Secondary | ICD-10-CM

## 2019-02-08 MED ORDER — LISINOPRIL 20 MG PO TABS: ORAL_TABLET | ORAL | 0 refills | Status: AC

## 2019-02-18 DIAGNOSIS — E782 Mixed hyperlipidemia: Secondary | ICD-10-CM | POA: Diagnosis not present

## 2019-02-18 DIAGNOSIS — I10 Essential (primary) hypertension: Secondary | ICD-10-CM | POA: Diagnosis not present

## 2019-02-18 DIAGNOSIS — Z6827 Body mass index (BMI) 27.0-27.9, adult: Secondary | ICD-10-CM | POA: Diagnosis not present

## 2019-03-10 DIAGNOSIS — Z6827 Body mass index (BMI) 27.0-27.9, adult: Secondary | ICD-10-CM | POA: Diagnosis not present

## 2019-03-10 DIAGNOSIS — I10 Essential (primary) hypertension: Secondary | ICD-10-CM | POA: Diagnosis not present

## 2019-03-15 ENCOUNTER — Other Ambulatory Visit: Payer: Self-pay | Admitting: Family Medicine

## 2019-03-15 MED ORDER — CARVEDILOL 6.25 MG PO TABS: ORAL_TABLET | ORAL | 0 refills | Status: DC

## 2019-03-15 MED ORDER — LOVASTATIN 40 MG PO TABS: ORAL_TABLET | ORAL | 3 refills | Status: AC

## 2019-03-15 NOTE — Addendum Note (Signed)
Addended by: Sharon Seller B on: 03/15/2019 10:45 AM   Modules accepted: Orders

## 2019-05-11 DIAGNOSIS — R739 Hyperglycemia, unspecified: Secondary | ICD-10-CM | POA: Diagnosis not present

## 2019-05-11 DIAGNOSIS — E782 Mixed hyperlipidemia: Secondary | ICD-10-CM | POA: Diagnosis not present

## 2019-05-11 DIAGNOSIS — M109 Gout, unspecified: Secondary | ICD-10-CM | POA: Diagnosis not present

## 2019-05-11 DIAGNOSIS — Z1331 Encounter for screening for depression: Secondary | ICD-10-CM | POA: Diagnosis not present

## 2019-05-11 DIAGNOSIS — I10 Essential (primary) hypertension: Secondary | ICD-10-CM | POA: Diagnosis not present

## 2019-05-11 DIAGNOSIS — G3184 Mild cognitive impairment, so stated: Secondary | ICD-10-CM | POA: Diagnosis not present

## 2019-05-11 DIAGNOSIS — Z0000 Encounter for general adult medical examination without abnormal findings: Secondary | ICD-10-CM | POA: Diagnosis not present

## 2019-05-11 DIAGNOSIS — Z79899 Other long term (current) drug therapy: Secondary | ICD-10-CM | POA: Diagnosis not present

## 2019-05-11 DIAGNOSIS — Z6827 Body mass index (BMI) 27.0-27.9, adult: Secondary | ICD-10-CM | POA: Diagnosis not present

## 2019-05-25 DIAGNOSIS — I10 Essential (primary) hypertension: Secondary | ICD-10-CM | POA: Diagnosis not present

## 2019-05-25 DIAGNOSIS — Z6827 Body mass index (BMI) 27.0-27.9, adult: Secondary | ICD-10-CM | POA: Diagnosis not present

## 2019-06-11 ENCOUNTER — Other Ambulatory Visit: Payer: Self-pay | Admitting: Family Medicine

## 2019-06-11 MED ORDER — CARVEDILOL 6.25 MG PO TABS: ORAL_TABLET | ORAL | 0 refills | Status: AC

## 2019-06-14 DIAGNOSIS — Z23 Encounter for immunization: Secondary | ICD-10-CM | POA: Diagnosis not present

## 2019-06-21 ENCOUNTER — Encounter: Payer: Self-pay | Admitting: Gastroenterology

## 2019-07-12 ENCOUNTER — Other Ambulatory Visit: Payer: Self-pay | Admitting: *Deleted

## 2019-07-12 DIAGNOSIS — Z23 Encounter for immunization: Secondary | ICD-10-CM | POA: Diagnosis not present

## 2019-07-12 DIAGNOSIS — I5042 Chronic combined systolic (congestive) and diastolic (congestive) heart failure: Secondary | ICD-10-CM

## 2019-07-12 MED ORDER — FUROSEMIDE 20 MG PO TABS: ORAL_TABLET | ORAL | 0 refills | Status: AC

## 2019-08-09 DIAGNOSIS — Z79899 Other long term (current) drug therapy: Secondary | ICD-10-CM | POA: Diagnosis not present

## 2019-08-09 DIAGNOSIS — E782 Mixed hyperlipidemia: Secondary | ICD-10-CM | POA: Diagnosis not present

## 2019-08-09 DIAGNOSIS — Z6828 Body mass index (BMI) 28.0-28.9, adult: Secondary | ICD-10-CM | POA: Diagnosis not present

## 2019-08-09 DIAGNOSIS — I10 Essential (primary) hypertension: Secondary | ICD-10-CM | POA: Diagnosis not present

## 2019-08-09 DIAGNOSIS — Z1331 Encounter for screening for depression: Secondary | ICD-10-CM | POA: Diagnosis not present

## 2019-08-09 DIAGNOSIS — M109 Gout, unspecified: Secondary | ICD-10-CM | POA: Diagnosis not present

## 2019-11-16 DIAGNOSIS — E782 Mixed hyperlipidemia: Secondary | ICD-10-CM | POA: Diagnosis not present

## 2019-11-16 DIAGNOSIS — Z6827 Body mass index (BMI) 27.0-27.9, adult: Secondary | ICD-10-CM | POA: Diagnosis not present

## 2019-11-16 DIAGNOSIS — Z79899 Other long term (current) drug therapy: Secondary | ICD-10-CM | POA: Diagnosis not present

## 2019-11-16 DIAGNOSIS — Z1331 Encounter for screening for depression: Secondary | ICD-10-CM | POA: Diagnosis not present

## 2019-11-16 DIAGNOSIS — I10 Essential (primary) hypertension: Secondary | ICD-10-CM | POA: Diagnosis not present

## 2019-11-16 DIAGNOSIS — M109 Gout, unspecified: Secondary | ICD-10-CM | POA: Diagnosis not present

## 2019-11-30 DIAGNOSIS — I10 Essential (primary) hypertension: Secondary | ICD-10-CM | POA: Diagnosis not present

## 2020-01-31 DIAGNOSIS — Z23 Encounter for immunization: Secondary | ICD-10-CM | POA: Diagnosis not present

## 2020-02-17 DIAGNOSIS — M109 Gout, unspecified: Secondary | ICD-10-CM | POA: Diagnosis not present

## 2020-02-17 DIAGNOSIS — Z1331 Encounter for screening for depression: Secondary | ICD-10-CM | POA: Diagnosis not present

## 2020-02-17 DIAGNOSIS — E782 Mixed hyperlipidemia: Secondary | ICD-10-CM | POA: Diagnosis not present

## 2020-02-17 DIAGNOSIS — I10 Essential (primary) hypertension: Secondary | ICD-10-CM | POA: Diagnosis not present

## 2020-02-17 DIAGNOSIS — I158 Other secondary hypertension: Secondary | ICD-10-CM | POA: Diagnosis not present

## 2020-02-17 DIAGNOSIS — Z79899 Other long term (current) drug therapy: Secondary | ICD-10-CM | POA: Diagnosis not present

## 2020-02-17 DIAGNOSIS — Z6827 Body mass index (BMI) 27.0-27.9, adult: Secondary | ICD-10-CM | POA: Diagnosis not present

## 2022-07-15 ENCOUNTER — Encounter: Payer: Self-pay | Admitting: *Deleted
